# Patient Record
Sex: Female | Born: 1970
Health system: Southern US, Community
[De-identification: ages and names within clinical notes are randomized; demographics above are authoritative.]

## PROBLEM LIST (undated history)

## (undated) DIAGNOSIS — Z8619 Personal history of other infectious and parasitic diseases: Secondary | ICD-10-CM

## (undated) DIAGNOSIS — B009 Herpesviral infection, unspecified: Secondary | ICD-10-CM

## (undated) DIAGNOSIS — D649 Anemia, unspecified: Secondary | ICD-10-CM

## (undated) DIAGNOSIS — T7840XA Allergy, unspecified, initial encounter: Secondary | ICD-10-CM

## (undated) HISTORY — DX: Personal history of other infectious and parasitic diseases: Z86.19

## (undated) HISTORY — DX: Allergy, unspecified, initial encounter: T78.40XA

## (undated) HISTORY — PX: HERNIA REPAIR: SHX51

## (undated) HISTORY — DX: Herpesviral infection, unspecified: B00.9

## (undated) HISTORY — DX: Anemia, unspecified: D64.9

---

## 2013-08-23 ENCOUNTER — Ambulatory Visit: Payer: BC Managed Care – PPO | Admitting: Obstetrics & Gynecology

## 2013-09-01 ENCOUNTER — Encounter: Payer: Self-pay | Admitting: Obstetrics & Gynecology

## 2013-09-01 ENCOUNTER — Ambulatory Visit (INDEPENDENT_AMBULATORY_CARE_PROVIDER_SITE_OTHER): Payer: BC Managed Care – PPO | Admitting: Obstetrics & Gynecology

## 2013-09-01 VITALS — BP 103/66 | HR 96 | Temp 99.4°F | Ht 63.0 in | Wt 122.0 lb

## 2013-09-01 DIAGNOSIS — N898 Other specified noninflammatory disorders of vagina: Secondary | ICD-10-CM

## 2013-09-01 DIAGNOSIS — A6000 Herpesviral infection of urogenital system, unspecified: Secondary | ICD-10-CM

## 2013-09-01 MED ORDER — VALACYCLOVIR HCL 500 MG PO TABS: 500.0000 mg | ORAL_TABLET | Freq: Two times a day (BID) | ORAL | Status: DC

## 2013-09-01 NOTE — Progress Notes (Signed)
Subjective:     Misty Flynn is a 42 y.o. female here for a routine exam.  Current complaints: vaginal discharge. Pt states her discharge is white. Pt denies any itching odor or burning.  Personal health questionnaire reviewed: yes.   Gynecologic History Patient's last menstrual period was 08/11/2013. Contraception: condoms Last Pap: 03/2013. Results were: normal   Obstetric History OB History  No data available     The following portions of the patient's history were reviewed and updated as appropriate: allergies, current medications, past family history, past medical history, past social history, past surgical history and problem list.  Review of Systems Pertinent items are noted in HPI.    Objective:  SPEC: thin, white discharge    Assessment:    Healthy female exam.  H/O genital herpes   Plan:   Orders Placed This Encounter  Procedures  . WET PREP BY MOLECULAR PROBE  . POCT Wet Prep [facility])  Return prn

## 2013-09-02 DIAGNOSIS — A6000 Herpesviral infection of urogenital system, unspecified: Secondary | ICD-10-CM | POA: Insufficient documentation

## 2013-09-02 LAB — POCT WET PREP (WET MOUNT)

## 2013-09-02 LAB — WET PREP BY MOLECULAR PROBE: Trichomonas vaginosis: NEGATIVE

## 2013-09-02 NOTE — Patient Instructions (Signed)
Genital Herpes  Genital herpes is a sexually transmitted disease. This means that it is a disease passed by having sex with an infected person. There is no cure for genital herpes. The time between attacks can be months to years. The virus may live in a person but produce no problems (symptoms). This infection can be passed to a baby as it travels down the birth canal (vagina). In a newborn, this can cause central nervous system damage, eye damage, or even death. The virus that causes genital herpes is usually HSV-2 virus. The virus that causes oral herpes is usually HSV-1. The diagnosis (learning what is wrong) is made through culture results.  SYMPTOMS   Usually symptoms of pain and itching begin a few days to a week after contact. It first appears as small blisters that progress to small painful ulcers which then scab over and heal after several days. It affects the outer genitalia, birth canal, cervix, penis, anal area, buttocks, and thighs.  HOME CARE INSTRUCTIONS   · Keep ulcerated areas dry and clean.  · Take medications as directed. Antiviral medications can speed up healing. They will not prevent recurrences or cure this infection. These medications can also be taken for suppression if there are frequent recurrences.  · While the infection is active, it is contagious. Avoid all sexual contact during active infections.  · Condoms may help prevent spread of the herpes virus.  · Practice safe sex.  · Wash your hands thoroughly after touching the genital area.  · Avoid touching your eyes after touching your genital area.  · Inform your caregiver if you have had genital herpes and become pregnant. It is your responsibility to insure a safe outcome for your baby in this pregnancy.  · Only take over-the-counter or prescription medicines for pain, discomfort, or fever as directed by your caregiver.  SEEK MEDICAL CARE IF:   · You have a recurrence of this infection.  · You do not respond to medications and are not  improving.  · You have new sources of pain or discharge which have changed from the original infection.  · You have an oral temperature above 102° F (38.9° C).  · You develop abdominal pain.  · You develop eye pain or signs of eye infection.  Document Released: 10/11/2000 Document Revised: 01/06/2012 Document Reviewed: 11/01/2009  ExitCare® Patient Information ©2014 [employer].

## 2013-10-04 ENCOUNTER — Ambulatory Visit: Payer: BC Managed Care – PPO | Admitting: Obstetrics & Gynecology

## 2014-02-28 ENCOUNTER — Ambulatory Visit: Payer: Self-pay | Admitting: Internal Medicine

## 2014-04-20 ENCOUNTER — Ambulatory Visit: Payer: BC Managed Care – PPO | Admitting: Obstetrics & Gynecology

## 2014-04-21 ENCOUNTER — Ambulatory Visit (INDEPENDENT_AMBULATORY_CARE_PROVIDER_SITE_OTHER): Payer: BC Managed Care – PPO | Admitting: Obstetrics & Gynecology

## 2014-04-21 ENCOUNTER — Encounter: Payer: Self-pay | Admitting: Obstetrics & Gynecology

## 2014-04-21 VITALS — BP 97/57 | HR 88 | Temp 97.9°F | Ht 62.0 in | Wt 124.0 lb

## 2014-04-21 DIAGNOSIS — N9489 Other specified conditions associated with female genital organs and menstrual cycle: Secondary | ICD-10-CM

## 2014-04-21 DIAGNOSIS — N907 Vulvar cyst: Secondary | ICD-10-CM

## 2014-04-21 NOTE — Progress Notes (Signed)
Patient ID: Leafy Half, female   DOB: Nov 16, 1970, 43 y.o.   MRN: [id number]  Chief Complaint  Patient presents with  . Vaginal Bump    HPI Misty Flynn is a 43 y.o. female.   Vaginal Discharge The patient's primary symptoms include genital lesions and a vaginal discharge. The current episode started 1 to 4 weeks ago. The problem has been unchanged. The patient is experiencing no pain.    History reviewed. No pertinent past medical history.  Past Surgical History  Procedure Laterality Date  . Cesarean section    . Hernia repair      Family History  Problem Relation Age of Onset  . Diabetes Maternal Grandmother   . Cancer Maternal Grandfather     Social History History  Substance Use Topics  . Smoking status: Never Smoker   . Smokeless tobacco: Not on file  . Alcohol Use: No    No Known Allergies  Current Outpatient Prescriptions  Medication Sig Dispense Refill  . valACYclovir (VALTREX) 500 MG tablet Take 1 tablet (500 mg total) by mouth 2 (two) times daily. With symptoms of outbreak.  30 tablet  3   No current facility-administered medications for this visit.    Review of Systems Review of Systems  Genitourinary: Positive for vaginal discharge.   Constitutional: negative for fatigue and weight loss Respiratory: negative for cough and wheezing Cardiovascular: negative for chest pain, fatigue and palpitations Gastrointestinal: negative for abdominal pain and change in bowel habits Genitourinary: positive for a bump Integument/breast: negative for nipple discharge Musculoskeletal:negative for myalgias Neurological: negative for gait problems and tremors Behavioral/Psych: negative for abusive relationship, depression Endocrine: negative for temperature intolerance     Blood pressure 97/57, pulse 88, temperature 97.9 F (36.6 C), height 5\' 2"  (1.575 m), weight 56.246 kg (124 lb), last menstrual period 03/26/2014.  Physical Exam Physical Exam General:    alert  Skin:   no rash or abnormalities  Lungs:   clear to auscultation bilaterally  Heart:   regular rate and rhythm, S1, S2 normal, no murmur, click, rub or gallop  Breasts:   normal without suspicious masses, skin or nipple changes or axillary nodes  Abdomen:  normal findings: no organomegaly, soft, non-tender and no hernia  Pelvis:  External genitalia: right labia minora with nodular area, 0.5 cm, NT        Data Reviewed None  Assessment    Likely sebaceous cyst--minimal symptoms    Plan    Possible management options include: expectant for now; excision for symptoms Follow up as needed.         JACKSON-MOORE,LISA A 04/21/2014, 11:04 AM

## 2014-04-22 NOTE — Patient Instructions (Signed)
Epidermal Cyst An epidermal cyst is sometimes called a sebaceous cyst, epidermal inclusion cyst, or infundibular cyst. These cysts usually contain a substance that looks "pasty" or "cheesy" and may have a bad smell. This substance is a protein called keratin. Epidermal cysts are usually found on the face, neck, or trunk. They may also occur in the vaginal area or other parts of the genitalia of both men and women. Epidermal cysts are usually small, painless, slow-growing bumps or lumps that move freely under the skin. It is important not to try to pop them. This may cause an infection and lead to tenderness and swelling. CAUSES  Epidermal cysts may be caused by a deep penetrating injury to the skin or a plugged hair follicle, often associated with acne. SYMPTOMS  Epidermal cysts can become inflamed and cause:  Redness.  Tenderness.  Increased temperature of the skin over the bumps or lumps.  Grayish-white, bad smelling material that drains from the bump or lump. DIAGNOSIS  Epidermal cysts are easily diagnosed by your caregiver during an exam. Rarely, a tissue sample (biopsy) may be taken to rule out other conditions that may resemble epidermal cysts. TREATMENT   Epidermal cysts often get better and disappear on their own. They are rarely ever cancerous.  If a cyst becomes infected, it may become inflamed and tender. This may require opening and draining the cyst. Treatment with antibiotics may be necessary. When the infection is gone, the cyst may be removed with minor surgery.  Small, inflamed cysts can often be treated with antibiotics or by injecting steroid medicines.  Sometimes, epidermal cysts become large and bothersome. If this happens, surgical removal in your caregiver's office may be necessary. HOME CARE INSTRUCTIONS  Only take over-the-counter or prescription medicines as directed by your caregiver.  Take your antibiotics as directed. Finish them even if you start to feel  better. SEEK MEDICAL CARE IF:   Your cyst becomes tender, red, or swollen.  Your condition is not improving or is getting worse.  You have any other questions or concerns. MAKE SURE YOU:  Understand these instructions.  Will watch your condition.  Will get help right away if you are not doing well or get worse. Document Released: 09/14/2004 Document Revised: 01/06/2012 Document Reviewed: 04/22/2011 ExitCare Patient Information 2015 [employer]. This information is not intended to replace advice given to you by your health care provider. Make sure you discuss any questions you have with your health care provider.  

## 2014-05-13 ENCOUNTER — Ambulatory Visit: Payer: Self-pay | Admitting: Internal Medicine

## 2014-07-18 ENCOUNTER — Other Ambulatory Visit: Payer: Self-pay | Admitting: Obstetrics & Gynecology

## 2014-07-18 DIAGNOSIS — Z1231 Encounter for screening mammogram for malignant neoplasm of breast: Secondary | ICD-10-CM

## 2014-07-28 ENCOUNTER — Ambulatory Visit (HOSPITAL_COMMUNITY): Admission: RE | Admit: 2014-07-28 | Payer: Self-pay | Source: Ambulatory Visit

## 2014-08-29 ENCOUNTER — Encounter: Payer: Self-pay | Admitting: Family Medicine

## 2014-08-29 ENCOUNTER — Ambulatory Visit (INDEPENDENT_AMBULATORY_CARE_PROVIDER_SITE_OTHER): Payer: BC Managed Care – PPO | Admitting: Family Medicine

## 2014-08-29 VITALS — BP 100/70 | HR 104 | Temp 98.2°F | Ht 62.5 in | Wt 124.6 lb

## 2014-08-29 DIAGNOSIS — A6000 Herpesviral infection of urogenital system, unspecified: Secondary | ICD-10-CM

## 2014-08-29 DIAGNOSIS — Z0000 Encounter for general adult medical examination without abnormal findings: Secondary | ICD-10-CM

## 2014-08-29 NOTE — Progress Notes (Signed)
Pre visit review using our clinic review tool, if applicable. No additional management support is needed unless otherwise documented below in the visit note. 

## 2014-08-29 NOTE — Progress Notes (Signed)
Preventive visit and establish care. HPI:  Misty Flynn is here to establish care.  Last PCP and physical: sees Dr. Glennon Mac in gyn for breast and paps.   Has the following chronic problems and concerns today:  Patient Active Problem List   Diagnosis Date Noted  . Genital herpes 09/02/2013   Wants CPE. Thinks had tdap. Poor diet. No exercise. Does women's health with gyn.  ROS negative for unless reported above: fevers, unintentional weight loss, hearing or vision loss, chest pain, palpitations, struggling to breath, hemoptysis, melena, hematochezia, hematuria, falls, loc, si, thoughts of self harm  Past Medical History  Diagnosis Date  . HSV (herpes simplex virus) infection   . History of chicken pox     Past Surgical History  Procedure Laterality Date  . Cesarean section    . Hernia repair      Family History  Problem Relation Age of Onset  . Diabetes Maternal Grandmother   . Cancer Maternal Grandfather     prostate    History   Social History  . Marital Status: Single    Spouse Name: N/A    Number of Children: N/A  . Years of Education: N/A   Social History Main Topics  . Smoking status: Never Smoker   . Smokeless tobacco: None  . Alcohol Use: No  . Drug Use: No  . Sexual Activity: Yes    Birth Control/ Protection: Condom     Comment: Sometimes   Other Topics Concern  . 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 With symptoms of outbreak., Disp: 30 tablet, Rfl: 3  EXAM:  Filed Vitals:   08/29/14 1435  BP: 100/70  Pulse: 104  Temp: 98.2 F (36.8 C)    Body mass index is 22.41 kg/(m^2).  GENERAL: vitals reviewed and listed above, alert,  oriented, appears well hydrated and in no acute distress  HEENT: atraumatic, conjunttiva clear, no obvious abnormalities on inspection of external nose and ears  NECK: no obvious masses on inspection  LUNGS: clear to auscultation bilaterally, no wheezes, rales or rhonchi, good air movement  CV: HRRR, no peripheral edema  GU: Declined  ABD: soft, NTTP  MS: moves all extremities without noticeable abnormality  PSYCH: pleasant and cooperative, no obvious depression or anxiety  ASSESSMENT AND PLAN:  Discussed the following assessment and plan:  Visit for preventive health examination - Plan: Lipid panel, Hemoglobin A1c  Genital herpes  -reviewed USPSTF level a and b recs -she will return for FASTING LABS -she will discuss screening mammo with her gynecologist -We reviewed the PMH, [place], FH, SH, Meds and Allergies. -We provided refills for any medications we will prescribe as needed. -We addressed current concerns per orders and patient instructions. -We have asked for records for pertinent exams, studies, vaccines and notes from previous providers. -We have advised patient to follow up per instructions below.   -Patient advised to return or notify a doctor immediately if symptoms worsen or persist or new concerns arise.  Patient Instructions  Before you leave: -schedule labs  -check on your tetanus vaccine  -We have ordered labs or studies  at this visit. It can take up to 1-2 weeks for results and processing. We will contact you with instructions IF your results are abnormal. Normal results will be released to your [facility]. If you have not heard from [place] or can not find your results in [facility] in 2 weeks please contact our office.   -We recommend the following healthy lifestyle measures: - eat a healthy diet consisting of lots of vegetables, fruits, beans, nuts, seeds, healthy meats such as white chicken and fish and whole grains.  - avoid fried foods, fast food, processed  foods, sodas, red meet and other fattening foods.  - get a least 150 minutes of aerobic exercise per week.           Colin Benton R.

## 2014-08-29 NOTE — Patient Instructions (Addendum)
Before you leave: -schedule labs  -check on your tetanus vaccine  -We have ordered labs or studies at this visit. It can take up to 1-2 weeks for results and processing. We will contact you with instructions IF your results are abnormal. Normal results will be released to your [facility]. If you have not heard from [place] or can not find your results in [facility] in 2 weeks please contact our office.   -We recommend the following healthy lifestyle measures: - eat a healthy diet consisting of lots of vegetables, fruits, beans, nuts, seeds, healthy meats such as white chicken and fish and whole grains.  - avoid fried foods, fast food, processed foods, sodas, red meet and other fattening foods.  - get a least 150 minutes of aerobic exercise per week.

## 2014-10-06 ENCOUNTER — Other Ambulatory Visit: Payer: Self-pay | Admitting: *Deleted

## 2014-10-06 DIAGNOSIS — Z1231 Encounter for screening mammogram for malignant neoplasm of breast: Secondary | ICD-10-CM

## 2014-10-06 DIAGNOSIS — A6000 Herpesviral infection of urogenital system, unspecified: Secondary | ICD-10-CM

## 2014-10-06 DIAGNOSIS — Z1239 Encounter for other screening for malignant neoplasm of breast: Secondary | ICD-10-CM

## 2014-10-06 MED ORDER — VALACYCLOVIR HCL 500 MG PO TABS: 500.0000 mg | ORAL_TABLET | Freq: Two times a day (BID) | ORAL | Status: DC

## 2014-10-20 ENCOUNTER — Ambulatory Visit: Payer: BC Managed Care – PPO | Admitting: Obstetrics & Gynecology

## 2014-10-24 ENCOUNTER — Encounter: Payer: Self-pay | Admitting: *Deleted

## 2014-10-25 ENCOUNTER — Encounter: Payer: Self-pay | Admitting: Obstetrics & Gynecology

## 2014-12-13 ENCOUNTER — Other Ambulatory Visit: Payer: Self-pay

## 2014-12-13 ENCOUNTER — Other Ambulatory Visit (INDEPENDENT_AMBULATORY_CARE_PROVIDER_SITE_OTHER): Payer: BLUE CROSS/BLUE SHIELD

## 2014-12-13 DIAGNOSIS — Z0000 Encounter for general adult medical examination without abnormal findings: Secondary | ICD-10-CM

## 2014-12-13 LAB — LIPID PANEL
CHOLESTEROL: 153 mg/dL (ref 0–200)
HDL: 43.5 mg/dL (ref >39.00)
LDL Cholesterol: 99 mg/dL (ref 0–99)
NonHDL: 109.5
TRIGLYCERIDES: 52 mg/dL (ref 0.0–149.0)
Total CHOL/HDL Ratio: 4
VLDL: 10.4 mg/dL (ref 0.0–40.0)

## 2014-12-13 LAB — HEMOGLOBIN A1C: Hgb A1c MFr Bld: 5.9 % (ref 4.6–6.5)

## 2014-12-19 ENCOUNTER — Ambulatory Visit: Payer: BC Managed Care – PPO | Admitting: Obstetrics

## 2014-12-29 ENCOUNTER — Ambulatory Visit: Payer: BLUE CROSS/BLUE SHIELD | Admitting: Certified Nurse Midwife

## 2014-12-29 ENCOUNTER — Ambulatory Visit: Payer: BC Managed Care – PPO | Admitting: Obstetrics

## 2015-01-18 ENCOUNTER — Ambulatory Visit: Payer: BLUE CROSS/BLUE SHIELD | Admitting: Certified Nurse Midwife

## 2015-02-28 ENCOUNTER — Ambulatory Visit: Payer: BLUE CROSS/BLUE SHIELD | Admitting: Certified Nurse Midwife

## 2015-03-01 ENCOUNTER — Other Ambulatory Visit: Payer: Self-pay | Admitting: Obstetrics

## 2015-03-01 ENCOUNTER — Encounter: Payer: Self-pay | Admitting: Certified Nurse Midwife

## 2015-03-01 ENCOUNTER — Ambulatory Visit (INDEPENDENT_AMBULATORY_CARE_PROVIDER_SITE_OTHER): Payer: BLUE CROSS/BLUE SHIELD | Admitting: Certified Nurse Midwife

## 2015-03-01 VITALS — BP 112/71 | HR 103 | Temp 96.9°F | Ht 62.0 in | Wt 120.0 lb

## 2015-03-01 DIAGNOSIS — A6000 Herpesviral infection of urogenital system, unspecified: Secondary | ICD-10-CM | POA: Diagnosis not present

## 2015-03-01 MED ORDER — LIDOCAINE VISCOUS 2 % MT SOLN: 20.0000 mL | OROMUCOSAL | Status: DC | PRN

## 2015-03-01 MED ORDER — VALACYCLOVIR HCL 500 MG PO TABS: 500.0000 mg | ORAL_TABLET | Freq: Two times a day (BID) | ORAL | Status: DC

## 2015-03-01 NOTE — Progress Notes (Signed)
Patient ID: Misty Flynn, female   DOB: [birth year], 44 y.o.   MRN: [id number]   Chief Complaint  Patient presents with  . Vaginitis    Vaginal Odor, Possible Herpes Outbreak    HPI Misty Flynn is a 44 y.o. female.  Having period, and then got herpes outbreak, having perineal swelling with discharge and odor.  Had recent stress from engagement party.  Had recent infection?flu.      HPI  Past Medical History  Diagnosis Date  . HSV (herpes simplex virus) infection   . History of chicken pox     Past Surgical History  Procedure Laterality Date  . Cesarean section    . Hernia repair      Family History  Problem Relation Age of Onset  . Diabetes Maternal Grandmother   . Cancer Maternal Grandfather     prostate    Social History History  Substance Use Topics  . Smoking status: Never Smoker   . Smokeless tobacco: Not on file  . Alcohol Use: No    No Known Allergies  Current Outpatient Prescriptions  Medication Sig Dispense Refill  . valACYclovir (VALTREX) 500 MG tablet Take 1 tablet (500 mg total) by mouth 2 (two) times daily. With symptoms of outbreak. 30 tablet 3  . lidocaine (XYLOCAINE) 2 % solution Use as directed 20 mLs in the mouth or throat as needed for mouth pain. 100 mL 0   No current facility-administered medications for this visit.    Review of Systems Review of Systems Constitutional: negative for fatigue and weight loss, +fever yesturday Respiratory: negative for cough and wheezing Cardiovascular: negative for chest pain, fatigue and palpitations Gastrointestinal: negative for abdominal pain and change in bowel habits Genitourinary: + pruritus, discharge Integument/breast: negative for nipple discharge Musculoskeletal:negative for myalgias Neurological: negative for gait problems and tremors Behavioral/Psych: negative for abusive relationship, depression Endocrine: negative for temperature intolerance     Blood pressure 112/71, pulse 103,  temperature 96.9 F (36.1 C), height 5\' 2"  (1.575 m), weight 54.432 kg (120 lb).  Physical Exam Physical Exam General:   alert  Skin:   no rash or abnormalities  Lungs:   clear to auscultation bilaterally  Heart:   regular rate and rhythm, S1, S2 normal, no murmur, click, rub or gallop  Breasts:   deferred  Abdomen:  normal findings: no organomegaly, soft, non-tender and no hernia  Pelvis:  External genitalia: + multiple pustule lesions on labia and introitus.  very tender to palpation     50% of 15 min visit spent on counseling and coordination of care.   Data Reviewed Previous medical hx, labs  Assessment     Current herpes simplex outbreak     Plan    Orders Placed This Encounter  Procedures  . SureSwab, Vaginosis/Vaginitis Plus  . HIV antibody (with reflex)  . Hepatitis B surface antigen  . RPR  . Hepatitis C antibody   Meds ordered this encounter  Medications  . valACYclovir (VALTREX) 500 MG tablet    Sig: Take 1 tablet (500 mg total) by mouth 2 (two) times daily. With symptoms of outbreak.    Dispense:  30 tablet    Refill:  3  . lidocaine (XYLOCAINE) 2 % solution    Sig: Use as directed 20 mLs in the mouth or throat as needed for mouth pain.    Dispense:  100 mL    Refill:  0     Follow up with annual exam.

## 2015-03-02 ENCOUNTER — Telehealth: Payer: Self-pay | Admitting: *Deleted

## 2015-03-02 ENCOUNTER — Other Ambulatory Visit: Payer: Self-pay | Admitting: *Deleted

## 2015-03-02 DIAGNOSIS — A6000 Herpesviral infection of urogenital system, unspecified: Secondary | ICD-10-CM

## 2015-03-02 LAB — HEPATITIS B SURFACE ANTIGEN: Hepatitis B Surface Ag: NEGATIVE

## 2015-03-02 LAB — RPR

## 2015-03-02 LAB — HIV ANTIBODY (ROUTINE TESTING W REFLEX): HIV 1&2 Ab, 4th Generation: NONREACTIVE

## 2015-03-02 LAB — HEPATITIS C ANTIBODY: HCV Ab: NEGATIVE

## 2015-03-02 MED ORDER — LIDOCAINE HCL 2 % EX GEL: 1.0000 "application " | Freq: Three times a day (TID) | CUTANEOUS | Status: DC

## 2015-03-02 NOTE — Progress Notes (Signed)
Pt called to office stating Rx was sent wrong.  Pt states that she was given a solution and not cream.   Lidocaine gel was ordered and sent to pharmacy.  LM on VM making her aware of Rx change.

## 2015-03-02 NOTE — Telephone Encounter (Signed)
Pt called to office regarding labs.  Pt was made aware of results.  Pt made aware that SureSwab was not yet resulted.  Pt advised that she may get a call once that has resulted and been reviewed.

## 2015-03-05 LAB — SURESWAB, VAGINOSIS/VAGINITIS PLUS
ATOPOBIUM VAGINAE: NOT DETECTED Log (cells/mL)
BV CATEGORY: UNDETERMINED — AB
C. PARAPSILOSIS, DNA: NOT DETECTED
C. TRACHOMATIS RNA, TMA: NOT DETECTED
C. TROPICALIS, DNA: NOT DETECTED
C. albicans, DNA: NOT DETECTED
C. glabrata, DNA: NOT DETECTED
Gardnerella vaginalis: 6.5 Log (cells/mL)
LACTOBACILLUS SPECIES: 8 Log (cells/mL)
MEGASPHAERA SPECIES: NOT DETECTED Log (cells/mL)
N. gonorrhoeae RNA, TMA: NOT DETECTED
T. vaginalis RNA, QL TMA: NOT DETECTED

## 2015-03-06 ENCOUNTER — Telehealth: Payer: Self-pay | Admitting: *Deleted

## 2015-03-06 LAB — HERPES SIMPLEX VIRUS CULTURE: ORGANISM ID, BACTERIA: NOT DETECTED

## 2015-03-06 NOTE — Telephone Encounter (Signed)
Patient is requesting acyclovir ointment. She states it helps more than anything.

## 2015-03-07 ENCOUNTER — Encounter (HOSPITAL_COMMUNITY): Payer: Self-pay | Admitting: *Deleted

## 2015-03-07 ENCOUNTER — Inpatient Hospital Stay (HOSPITAL_COMMUNITY)
Admission: AD | Admit: 2015-03-07 | Discharge: 2015-03-07 | Disposition: A | Discharge status: Home / Self Care | Payer: BLUE CROSS/BLUE SHIELD | Source: Ambulatory Visit | Attending: Obstetrics | Admitting: Obstetrics

## 2015-03-07 ENCOUNTER — Other Ambulatory Visit: Payer: Self-pay | Admitting: Certified Nurse Midwife

## 2015-03-07 DIAGNOSIS — A57 Chancroid: Secondary | ICD-10-CM | POA: Diagnosis not present

## 2015-03-07 DIAGNOSIS — A510 Primary genital syphilis: Secondary | ICD-10-CM | POA: Diagnosis not present

## 2015-03-07 DIAGNOSIS — N898 Other specified noninflammatory disorders of vagina: Secondary | ICD-10-CM | POA: Insufficient documentation

## 2015-03-07 LAB — URINALYSIS, ROUTINE W REFLEX MICROSCOPIC
BILIRUBIN URINE: NEGATIVE
Glucose, UA: NEGATIVE mg/dL
Hgb urine dipstick: NEGATIVE
KETONES UR: NEGATIVE mg/dL
LEUKOCYTES UA: NEGATIVE
NITRITE: NEGATIVE
Protein, ur: 30 mg/dL — AB
Specific Gravity, Urine: 1.015 (ref 1.005–1.030)
Urobilinogen, UA: 0.2 mg/dL (ref 0.0–1.0)
pH: 8 (ref 5.0–8.0)

## 2015-03-07 LAB — URINE MICROSCOPIC-ADD ON

## 2015-03-07 LAB — WET PREP, GENITAL
Clue Cells Wet Prep HPF POC: NONE SEEN
TRICH WET PREP: NONE SEEN
Yeast Wet Prep HPF POC: NONE SEEN

## 2015-03-07 LAB — POCT PREGNANCY, URINE: PREG TEST UR: NEGATIVE

## 2015-03-07 MED ORDER — CEFTRIAXONE SODIUM 250 MG IJ SOLR
250.0000 mg | Freq: Once | INTRAMUSCULAR | Status: AC
  Administered 2015-03-07: 250 mg via INTRAMUSCULAR
  Filled 2015-03-07: qty 250

## 2015-03-07 MED ORDER — LIDOCAINE HCL 2 % EX GEL: 1.0000 "application " | Freq: Three times a day (TID) | CUTANEOUS | Status: DC

## 2015-03-07 NOTE — MAU Note (Signed)
PT SAYS  SHE GOES TO [facility] .   NO MEDS .    OFFICE  CALLED HER TODAY  AND  TOLD HER TO COME TO [facility]  TO GET CX-  OFFICE SAYS SHE HAS BV  AND  SHE NEEDED TO GET TREATMENT-   BUT THE RESUTS WERE ALL NEG.       STILL HAS VAG   D/C-  WITH ODOR-  [place].   NO BIRTH CONTROL.  LAST SEX-    April

## 2015-03-07 NOTE — Discharge Instructions (Signed)
Herpes Labialis  You have a fever blister or cold sore (herpes labialis). These painful, grouped sores are caused by one of the herpes viruses (HSV1 most commonly). They are usually found around the lips and mouth, but the same infection can also affect other areas on the face such as the nose and eyes. Herpes infections take about 10 days to heal. They often occur again and again in the same spot. Other symptoms may include numbness and tingling in the involved skin, achiness, fever, and swollen glands in the neck. Colds, emotional stress, injuries, or excess sunlight exposure all seem to make herpes reappear. Herpes lip infections are contagious. Direct contact with these sores can spread the infection. It can also be spread to other parts of your own body.  TREATMENT   Herpes labialis is usually self-limited and resolves within 1 week. To reduce pain and swelling, apply ice packs frequently to the sores or suck on popsicles or frozen juice bars. Antiviral medicine may be used by mouth to shorten the duration of the breakout. Avoid spreading the infection by washing your hands often. Be careful not to touch your eyes or genital areas after handling the infected blisters. Do not kiss or have other intimate contact with others. After the blisters are completely healed you may resume contact. Use sunscreen to lessen recurrences.   If this is your first infection with herpes, or if you have a severe or repeated infections, your caregiver may prescribe one of the anti-viral drugs to speed up the healing. If you have sun-related flare-ups despite the use of sunscreen, starting oral anti-viral medicine before a prolonged exposure (going skiing or to the beach) can prevent most episodes.   SEEK IMMEDIATE MEDICAL CARE IF:  · You develop a headache, sleepiness, high fever, vomiting, or severe weakness.  · You have eye irritation, pain, blurred vision or redness.  · You develop a prolonged infection not getting better in 10  days.  Document Released: 10/14/2005 Document Revised: 01/06/2012 Document Reviewed: 08/18/2009  ExitCare® Patient Information ©2015 [employer]. This information is not intended to replace advice given to you by your health care provider. Make sure you discuss any questions you have with your health care provider.

## 2015-03-07 NOTE — Telephone Encounter (Signed)
Patient has been notified of lab results- she works in [place] and she doesn't get back in town before we close. Per Dr Jodi Mourning- patient advised to go to triage- will have them call him for orders and he will treat patient at that time.

## 2015-03-07 NOTE — Telephone Encounter (Signed)
Most likely chancroid, needs Rocephin 250mg  IM along with PCR culture testing for H. Ducreyi.  Her partner also needs to be treated.  Thank you.

## 2015-03-07 NOTE — MAU Provider Note (Signed)
History     CSN: [id number]  Arrival date and time: 03/07/15 1851   First Provider Initiated Contact with Patient 03/07/15 2039      No chief complaint on file.  HPI  [name] is a 44 y.o. [id number] who presents to MAU today with complaint of vaginal sore and need for additional testing. The patient states that just after her menses, LMP 02/24/15, she developed fever and presented to Urgent Care in [place]. She states that she was diagnosed with [facility] Spotted Fever. She then states that last Wednesday she noted labial swelling and a sore. She was concerned for HSV outbreak, since she had been diagnosed with HSV many years ago, but states she had not had an outbreak in years. She called [facility] and was seen in the office on 03/01/15. She had testing done that day including RPR, HIV, HSV, GC/Chlamydia, HBV, HCV and BV culture. All tests were negative aside from BV which was equivocal. The patient states that symptoms were not improving and they had given her Rx for the wrong medication. She was supposed to get a lidocaine gel and was instead given a lidocaine solution which she did not use. She had started using and OTC "cold sore cream" with minimal relief. She called the office and requested topical acyclovir, which was sent to her pharmacy today. When she called the office today for new Rx she was asked to come in for further testing since symptoms had not improved and all other testing was negative. Patient told them she was at work and wouldn't be able to get to the office before closing tonight. Staff from [place] instructed patient to come to triage for further testing.   Patient states that she has had a yellow vaginal discharge x 6 days with mild odor noted. She denies vaginal bleeding. She did note some blood from the "sore" on her labia, but none today. She also noted some purulent drainage from this area previous, but none since she started using OTC "cold sore" cream. She  denies fever or N/V/D today. She continues to have pain at the site of the ulceration and mild labial swelling and associated discomfort.    OB History    Gravida Para Term Preterm AB TAB SAB Ectopic Multiple Living   3 0 0  2 2    1       Past Medical History  Diagnosis Date  . HSV (herpes simplex virus) infection   . History of chicken pox     Past Surgical History  Procedure Laterality Date  . Cesarean section    . Hernia repair      Family History  Problem Relation Age of Onset  . Diabetes Maternal Grandmother   . Cancer Maternal Grandfather     prostate    History  Substance Use Topics  . Smoking status: Never Smoker   . Smokeless tobacco: Not on file  . Alcohol Use: No    Allergies: No Known Allergies  Prescriptions prior to admission  Medication Sig Dispense Refill Last Dose  . Cyanocobalamin (B-12 PO) Take 1 tablet by mouth daily.   Past Week at Unknown time  . OVER THE COUNTER MEDICATION Apply 1 application topically 3 (three) times daily. Patient is using OTC ointment for cold sores.   03/07/2015 at Unknown time  . valACYclovir (VALTREX) 500 MG tablet Take 1 tablet (500 mg total) by mouth 2 (two) times daily. With symptoms of outbreak. 30 tablet 3 03/07/2015 at Unknown  time  . lidocaine (XYLOCAINE) 2 % jelly Apply 1 application topically 3 (three) times daily. (Patient not taking: Reported on 03/07/2015) 30 mL 0 Not Taking at Unknown time  . lidocaine (XYLOCAINE) 2 % solution Use as directed 20 mLs in the mouth or throat as needed for mouth pain. (Patient not taking: Reported on 03/07/2015) 100 mL 0 Not Taking at Unknown time    Review of Systems  Constitutional: Negative for fever and malaise/fatigue.  Gastrointestinal: Negative for nausea, vomiting, abdominal pain and diarrhea.  Genitourinary: Negative for dysuria, urgency and frequency.       + vaginal discharge, labial swelling, ulceration of the skin of the labia Neg - vaginal bleeding   Physical Exam    Blood pressure 100/69, pulse 87, temperature 98.1 F (36.7 C), temperature source Oral, height 5\' 1"  (1.549 m), weight 127 lb 2 oz (57.664 kg), last menstrual period 02/24/2015.  Physical Exam  Nursing note and vitals reviewed. Constitutional: She is oriented to person, place, and time. She appears well-developed and well-nourished. No distress.  HENT:  Head: Normocephalic and atraumatic.  Cardiovascular: Normal rate.   Respiratory: Effort normal.  GI: Soft.  Genitourinary:    Vaginal discharge (thick, white discharge noted) found.  Wet prep obtained without speculum due to patient discomfort. Small amount of thick, white discharge noted. No blood.   Neurological: She is alert and oriented to person, place, and time.  Skin: Skin is warm and dry. No erythema.  Psychiatric: She has a normal mood and affect.   Results for orders placed or performed during the hospital encounter of 03/07/15 (from the past 24 hour(s))  Urinalysis, Routine w reflex microscopic     Status: Abnormal   Collection Time: 03/07/15  7:44 PM  Result Value Ref Range   Color, Urine YELLOW YELLOW   APPearance CLEAR CLEAR   Specific Gravity, Urine 1.015 1.005 - 1.030   pH 8.0 5.0 - 8.0   Glucose, UA NEGATIVE NEGATIVE mg/dL   Hgb urine dipstick NEGATIVE NEGATIVE   Bilirubin Urine NEGATIVE NEGATIVE   Ketones, ur NEGATIVE NEGATIVE mg/dL   Protein, ur 30 (A) NEGATIVE mg/dL   Urobilinogen, UA 0.2 0.0 - 1.0 mg/dL   Nitrite NEGATIVE NEGATIVE   Leukocytes, UA NEGATIVE NEGATIVE  Urine microscopic-add on     Status: None   Collection Time: 03/07/15  7:44 PM  Result Value Ref Range   Squamous Epithelial / LPF RARE RARE   WBC, UA 0-2 <3 WBC/hpf   RBC / HPF 0-2 <3 RBC/hpf   Bacteria, UA RARE RARE   Urine-Other MUCOUS PRESENT   Pregnancy, urine POC     Status: None   Collection Time: 03/07/15  8:01 PM  Result Value Ref Range   Preg Test, Ur NEGATIVE NEGATIVE    MAU Course  Procedures None  MDM UPT -  negative UA, wet prep, wound culture and gram stain today 250 mg IM rocephin given to patient  Assessment and Plan  A: [place], possible H. [place]  P: Discharge home Patient treated with Rocephin in MAU Rx for lidocaine gel sent to patient's pharmacy Warning signs for worsening condition discussed Wound culture and gram stain pending. Results will be sent directly to [facility] as they were originally ordered by Kandis Cocking, CNM Patient advised to follow-up with [facility] as scheduled or sooner PRN Patient may return to MAU as needed or if her condition were to change or worsen   Luvenia Redden, PA-C  03/07/2015, 8:39 PM

## 2015-03-08 LAB — GRAM STAIN
Gram Stain: NONE SEEN
Special Requests: NORMAL

## 2015-03-11 LAB — CULTURE, ROUTINE-GENITAL
CULTURE: NORMAL
SPECIAL REQUESTS: NORMAL

## 2015-03-14 ENCOUNTER — Other Ambulatory Visit: Payer: Self-pay | Admitting: Obstetrics

## 2015-03-14 DIAGNOSIS — B9689 Other specified bacterial agents as the cause of diseases classified elsewhere: Secondary | ICD-10-CM

## 2015-03-14 DIAGNOSIS — L089 Local infection of the skin and subcutaneous tissue, unspecified: Principal | ICD-10-CM

## 2015-03-14 MED ORDER — SULFAMETHOXAZOLE-TRIMETHOPRIM 800-160 MG PO TABS: 1.0000 | ORAL_TABLET | Freq: Two times a day (BID) | ORAL | Status: DC

## 2015-04-12 ENCOUNTER — Ambulatory Visit: Payer: BLUE CROSS/BLUE SHIELD | Admitting: Obstetrics

## 2015-04-25 ENCOUNTER — Ambulatory Visit: Payer: BLUE CROSS/BLUE SHIELD | Admitting: Obstetrics

## 2015-04-25 ENCOUNTER — Ambulatory Visit (INDEPENDENT_AMBULATORY_CARE_PROVIDER_SITE_OTHER): Payer: BLUE CROSS/BLUE SHIELD | Admitting: Obstetrics

## 2015-04-25 ENCOUNTER — Encounter: Payer: Self-pay | Admitting: Certified Nurse Midwife

## 2015-04-25 VITALS — BP 91/60 | HR 82 | Temp 98.3°F | Ht 62.0 in | Wt 123.0 lb

## 2015-04-25 DIAGNOSIS — Z01419 Encounter for gynecological examination (general) (routine) without abnormal findings: Secondary | ICD-10-CM

## 2015-04-25 DIAGNOSIS — N941 Dyspareunia: Secondary | ICD-10-CM | POA: Diagnosis not present

## 2015-04-25 DIAGNOSIS — Z30011 Encounter for initial prescription of contraceptive pills: Secondary | ICD-10-CM | POA: Diagnosis not present

## 2015-04-25 DIAGNOSIS — Z124 Encounter for screening for malignant neoplasm of cervix: Secondary | ICD-10-CM

## 2015-04-25 DIAGNOSIS — N943 Premenstrual tension syndrome: Secondary | ICD-10-CM

## 2015-04-25 DIAGNOSIS — N939 Abnormal uterine and vaginal bleeding, unspecified: Secondary | ICD-10-CM

## 2015-04-25 DIAGNOSIS — IMO0002 Reserved for concepts with insufficient information to code with codable children: Secondary | ICD-10-CM

## 2015-04-25 MED ORDER — NORETHIN ACE-ETH ESTRAD-FE 1-20 MG-MCG(24) PO TABS: 1.0000 | ORAL_TABLET | Freq: Every day | ORAL | Status: DC

## 2015-04-25 NOTE — Progress Notes (Signed)
Subjective:        Misty Flynn is a 44 y.o. female here for a routine exam.  Current complaints: Occurrence of pain with intercourse on one occasion.  Recently treated for a labial sebaceous cyst.  Denies vaginal dryness or discharge.  Also c/o extreme irritability, bloating and meanness around period time only.  Treats family very bad around this time.  Personal health questionnaire:  Is patient [name], have a family history of breast and/or ovarian cancer: no Is there a family history of uterine cancer diagnosed at age < 23, gastrointestinal cancer, urinary tract cancer, family member who is a Field seismologist syndrome-associated carrier: no Is the patient overweight and hypertensive, family history of diabetes, personal history of gestational diabetes, preeclampsia or PCOS: no Is patient over 78, have PCOS,  family history of premature CHD under age 22, diabetes, smoke, have hypertension or peripheral artery disease:  no At any time, has a partner hit, kicked or otherwise hurt or frightened you?: no Over the past 2 weeks, have you felt down, depressed or hopeless?: no Over the past 2 weeks, have you felt little interest or pleasure in doing things?:no   Gynecologic History Patient's last menstrual period was 04/12/2015. Contraception: none Last Pap: 2015. Results were: normal Last mammogram: 2016. Results were: normal  Obstetric History OB History  Gravida Para Term Preterm AB SAB TAB Ectopic Multiple Living  3 0 0  2  2   1     # Outcome Date GA Lbr Len/2nd Weight Sex Delivery Anes PTL Lv  3 TAB           2 Gravida      CS-LTranv     1 TAB               Past Medical History  Diagnosis Date  . HSV (herpes simplex virus) infection   . History of chicken pox     Past Surgical History  Procedure Laterality Date  . Cesarean section    . Hernia repair       Current outpatient prescriptions:  .  valACYclovir (VALTREX) 500 MG tablet, Take 1 tablet (500 mg total) by  mouth 2 (two) times daily. With symptoms of outbreak., Disp: 30 tablet, Rfl: 3 .  Cyanocobalamin (B-12 PO), Take 1 tablet by mouth daily., Disp: , Rfl:  .  lidocaine (XYLOCAINE) 2 % jelly, Apply 1 application topically 3 (three) times daily. (Patient not taking: Reported on 04/25/2015), Disp: 30 mL, Rfl: 0 .  Norethindrone Acetate-Ethinyl Estrad-FE (LOESTRIN 24 FE) 1-20 MG-MCG(24) tablet, Take 1 tablet by mouth daily., Disp: 1 Package, Rfl: 11 .  OVER THE COUNTER MEDICATION, Apply 1 application topically 3 (three) times daily. Patient is using OTC ointment for cold sores., Disp: , Rfl:  .  sulfamethoxazole-trimethoprim (BACTRIM DS,SEPTRA DS) 800-160 MG per tablet, Take 1 tablet by mouth 2 (two) times daily. (Patient not taking: Reported on 04/25/2015), Disp: 28 tablet, Rfl: 1 No Known Allergies  History  Substance Use Topics  . Smoking status: Never Smoker   . Smokeless tobacco: Not on file  . Alcohol Use: No    Family History  Problem Relation Age of Onset  . Diabetes Maternal Grandmother   . Cancer Maternal Grandfather     prostate      Review of Systems  Constitutional: negative for fatigue and weight loss Respiratory: negative for cough and wheezing Cardiovascular: negative for chest pain, fatigue and palpitations Gastrointestinal: negative for abdominal pain and change in bowel habits  Musculoskeletal:negative for myalgias Neurological: negative for gait problems and tremors Behavioral/Psych: negative for abusive relationship, depression.  Positive for irritability, bloating and meanness around period time Endocrine: negative for temperature intolerance   Genitourinary:positive for abnormal menstrual periods.  Negative for genital lesions, hot flashes, sexual problems and vaginal discharge Integument/breast: negative for breast lump, breast tenderness, nipple discharge and skin lesion(s)    Objective:       BP 91/60 mmHg  Pulse 82  Temp(Src) 98.3 F (36.8 C)  Ht 5\' 2"   (1.575 m)  Wt 123 lb (55.792 kg)  BMI 22.49 kg/m2  LMP 04/12/2015 General:   alert  Skin:   no rash or abnormalities  Lungs:   clear to auscultation bilaterally  Heart:   regular rate and rhythm, S1, S2 normal, no murmur, click, rub or gallop  Breasts:   normal without suspicious masses, skin or nipple changes or axillary nodes  Abdomen:  normal findings: no organomegaly, soft, non-tender and no hernia  Pelvis:  External genitalia: normal general appearance Urinary system: urethral meatus normal and bladder without fullness, nontender Vaginal: normal without tenderness, induration or masses Cervix: normal appearance Adnexa: normal bimanual exam Uterus: anteverted and non-tender, normal size   Lab Review Urine pregnancy test Labs reviewed yes Radiologic studies reviewed yes    Assessment:    Healthy female exam.    AUB.  Irregular cycles.  Dyspareunia, one occasion  Contraceptive management and cycle regulation  Probable PMS     Plan:   Ultrasound ordered Loestrin 24 Rx.  Will follow dyspareunia clinically for recurrence Discussed diagnosis and treatment of PMS.  Literature dispensed.    Education reviewed: calcium supplements and self breast exams. Contraception: OCP (estrogen/progesterone). Follow up in: 2 weeks.   Meds ordered this encounter  Medications  . Norethindrone Acetate-Ethinyl Estrad-FE (LOESTRIN 24 FE) 1-20 MG-MCG(24) tablet    Sig: Take 1 tablet by mouth daily.    Dispense:  1 Package    Refill:  11   Orders Placed This Encounter  Procedures  . US Transvaginal Non-OB    Standing Status: Future     Number of Occurrences:      Standing Expiration Date: 06/24/2016    Order Specific Question:  Reason for Exam (SYMPTOM  OR DIAGNOSIS REQUIRED)    Answer:  Irregular cycles    Order Specific Question:  Preferred imaging location?    Answer:  Internal  . US Pelvis Complete    Standing Status: Future     Number of Occurrences:      Standing  Expiration Date: 06/24/2016    Order Specific Question:  Reason for Exam (SYMPTOM  OR DIAGNOSIS REQUIRED)    Answer:  Irregular cycles    Order Specific Question:  Preferred imaging location?    Answer:  Internal

## 2015-04-28 ENCOUNTER — Other Ambulatory Visit: Payer: Self-pay | Admitting: Obstetrics

## 2015-04-28 LAB — PAP IG AND HPV HIGH-RISK: HPV DNA HIGH RISK: NOT DETECTED

## 2015-05-02 NOTE — Progress Notes (Signed)
Patient notified- will review result with Dr Jodi Mourning at appointment 7/21

## 2015-05-12 ENCOUNTER — Encounter: Payer: BLUE CROSS/BLUE SHIELD | Admitting: Medical

## 2015-05-18 ENCOUNTER — Encounter: Payer: Self-pay | Admitting: Obstetrics

## 2015-05-18 ENCOUNTER — Telehealth: Payer: Self-pay

## 2015-05-18 ENCOUNTER — Ambulatory Visit (INDEPENDENT_AMBULATORY_CARE_PROVIDER_SITE_OTHER): Payer: BLUE CROSS/BLUE SHIELD

## 2015-05-18 ENCOUNTER — Ambulatory Visit (INDEPENDENT_AMBULATORY_CARE_PROVIDER_SITE_OTHER): Payer: BLUE CROSS/BLUE SHIELD | Admitting: Obstetrics

## 2015-05-18 VITALS — BP 114/71 | HR 80 | Temp 98.4°F | Ht 62.0 in | Wt 125.0 lb

## 2015-05-18 DIAGNOSIS — R87610 Atypical squamous cells of undetermined significance on cytologic smear of cervix (ASC-US): Secondary | ICD-10-CM | POA: Diagnosis not present

## 2015-05-18 DIAGNOSIS — D250 Submucous leiomyoma of uterus: Secondary | ICD-10-CM | POA: Diagnosis not present

## 2015-05-18 DIAGNOSIS — N939 Abnormal uterine and vaginal bleeding, unspecified: Secondary | ICD-10-CM

## 2015-05-18 DIAGNOSIS — N946 Dysmenorrhea, unspecified: Secondary | ICD-10-CM

## 2015-05-18 DIAGNOSIS — N943 Premenstrual tension syndrome: Secondary | ICD-10-CM | POA: Diagnosis not present

## 2015-05-18 MED ORDER — IBUPROFEN 800 MG PO TABS: 800.0000 mg | ORAL_TABLET | Freq: Three times a day (TID) | ORAL | Status: DC | PRN

## 2015-05-18 NOTE — Telephone Encounter (Signed)
Per Dr. Jodi Mourning, patient does not need sonohystergram in 3 months or follow up visit - can come back in a year for annual - let patient know and sch her next annual 05/21/2016

## 2015-05-18 NOTE — Progress Notes (Signed)
Patient ID: Misty Flynn, female   DOB: May 30, 1971, 44 y.o.   MRN: [id number]  Chief Complaint  Patient presents with  . Follow-up    HPI Misty Flynn is a 44 y.o. female.  Presents for follow up of abnormal pap smear.  She also has PMS and Dysmenorrhea, and was given treatment recommendations on her last office visit, and is following up on those problems also.  Has history of AUB with irregular cycles but no intermenstrual bleeding.  Ultrasound is being done today. HPI  Past Medical History  Diagnosis Date  . HSV (herpes simplex virus) infection   . History of chicken pox     Past Surgical History  Procedure Laterality Date  . Cesarean section    . Hernia repair      Family History  Problem Relation Age of Onset  . Diabetes Maternal Grandmother   . Cancer Maternal Grandfather     prostate    Social History History  Substance Use Topics  . Smoking status: Never Smoker   . Smokeless tobacco: Not on file  . Alcohol Use: No    No Known Allergies  Current Outpatient Prescriptions  Medication Sig Dispense Refill  . Norethindrone Acetate-Ethinyl Estrad-FE (LOESTRIN 24 FE) 1-20 MG-MCG(24) tablet Take 1 tablet by mouth daily. 1 Package 11  . Cyanocobalamin (B-12 PO) Take 1 tablet by mouth daily.    Marland Kitchen ibuprofen (ADVIL,MOTRIN) 800 MG tablet Take 1 tablet (800 mg total) by mouth every 8 (eight) hours as needed for cramping. 30 tablet prn  . lidocaine (XYLOCAINE) 2 % jelly Apply 1 application topically 3 (three) times daily. (Patient not taking: Reported on 04/25/2015) 30 mL 0  . OVER THE COUNTER MEDICATION Apply 1 application topically 3 (three) times daily. Patient is using OTC ointment for cold sores.    Marland Kitchen sulfamethoxazole-trimethoprim (BACTRIM DS,SEPTRA DS) 800-160 MG per tablet Take 1 tablet by mouth 2 (two) times daily. (Patient not taking: Reported on 04/25/2015) 28 tablet 1  . valACYclovir (VALTREX) 500 MG tablet Take 1 tablet (500 mg total) by mouth 2 (two) times daily.  With symptoms of outbreak. (Patient not taking: Reported on 05/18/2015) 30 tablet 3   No current facility-administered medications for this visit.    Review of Systems Review of Systems Constitutional: negative for fatigue and weight loss Respiratory: negative for cough and wheezing Cardiovascular: negative for chest pain, fatigue and palpitations Gastrointestinal: negative for abdominal pain and change in bowel habits Genitourinary: positive for AUB Integument/breast: negative for nipple discharge Musculoskeletal:negative for myalgias Neurological: negative for gait problems and tremors Behavioral/Psych: negative for abusive relationship, depression.  Positive for PMS. Endocrine: negative for temperature intolerance     Blood pressure 114/71, pulse 80, temperature 98.4 F (36.9 C), height 5\' 2"  (1.575 m), weight 125 lb (56.7 kg), last menstrual period 04/12/2015.  Physical Exam Physical Exam:  Deferred  100% of 20 min visit spent on counseling and coordination of care.   Data Reviewed Ultrasound Pap Smear  Assessment     ASCUS with negative High Risk HPV DNA AUB Uterine Fibroid - submucous Dysmenorrhea PMS     Plan    Repeat pap 1 year OCP's Rx for cycle regulation Will follow uterine fibroid with yearly ultrasounds Ibuprofen Rx for Dysmenorrhea Continue avoidance of caffeine around period time for PMS  No orders of the defined types were placed in this encounter.   Meds ordered this encounter  Medications  . ibuprofen (ADVIL,MOTRIN) 800 MG tablet    Sig: Take 1  tablet (800 mg total) by mouth every 8 (eight) hours as needed for cramping.    Dispense:  30 tablet    Refill:  prn

## 2015-11-07 ENCOUNTER — Other Ambulatory Visit: Payer: Self-pay | Admitting: *Deleted

## 2015-11-07 ENCOUNTER — Telehealth: Payer: Self-pay | Admitting: *Deleted

## 2015-11-07 DIAGNOSIS — Z30011 Encounter for initial prescription of contraceptive pills: Secondary | ICD-10-CM

## 2015-11-07 MED ORDER — NORETHIN ACE-ETH ESTRAD-FE 1-20 MG-MCG(24) PO TABS: 1.0000 | ORAL_TABLET | Freq: Every day | ORAL | Status: DC

## 2015-11-07 NOTE — Telephone Encounter (Signed)
Patient called yesterday and she got stuck out of town in the snow. She has missed her birth control for several days and wants to know what to do. Told patient how to double up to catch up- she is using for cycle control- her partner has had a vasectomy so she is not concerned about pregnancy. She may have BTB. Sent a spare pack to [facility] in [place] per her request.

## 2015-11-16 ENCOUNTER — Telehealth: Payer: Self-pay | Admitting: *Deleted

## 2015-11-16 NOTE — Telephone Encounter (Signed)
Patient is requesting a refill of Ibuprofen 800 mg she is out.

## 2015-11-17 ENCOUNTER — Other Ambulatory Visit: Payer: Self-pay | Admitting: Obstetrics

## 2015-11-17 DIAGNOSIS — N946 Dysmenorrhea, unspecified: Secondary | ICD-10-CM

## 2015-11-17 MED ORDER — IBUPROFEN 800 MG PO TABS
800.0000 mg | ORAL_TABLET | Freq: Three times a day (TID) | ORAL | Status: DC | PRN
Start: 2015-11-17 — End: 2016-03-18

## 2015-11-17 NOTE — Telephone Encounter (Signed)
Ibuprofen Rx. 

## 2015-11-27 NOTE — Telephone Encounter (Signed)
Rx was sent to pharmacy by provider  

## 2015-11-28 ENCOUNTER — Ambulatory Visit (INDEPENDENT_AMBULATORY_CARE_PROVIDER_SITE_OTHER): Payer: Managed Care, Other (non HMO) | Admitting: Family Medicine

## 2015-11-28 ENCOUNTER — Encounter: Payer: Self-pay | Admitting: Family Medicine

## 2015-11-28 VITALS — BP 98/70 | HR 83 | Temp 98.2°F | Ht 61.5 in | Wt 127.6 lb

## 2015-11-28 DIAGNOSIS — Z0000 Encounter for general adult medical examination without abnormal findings: Secondary | ICD-10-CM

## 2015-11-28 DIAGNOSIS — R002 Palpitations: Secondary | ICD-10-CM | POA: Diagnosis not present

## 2015-11-28 DIAGNOSIS — D649 Anemia, unspecified: Secondary | ICD-10-CM | POA: Diagnosis not present

## 2015-11-28 LAB — CBC
HCT: 36.8 % (ref 36.0–46.0)
HEMOGLOBIN: 11.9 g/dL — AB (ref 12.0–15.0)
MCHC: 32.3 g/dL (ref 30.0–36.0)
MCV: 79.8 fl (ref 78.0–100.0)
PLATELETS: 320 10*3/uL (ref 150.0–400.0)
RBC: 4.61 Mil/uL (ref 3.87–5.11)
RDW: 15.9 % — AB (ref 11.5–15.5)
WBC: 5.2 10*3/uL (ref 4.0–10.5)

## 2015-11-28 LAB — BASIC METABOLIC PANEL
BUN: 14 mg/dL (ref 6–23)
CALCIUM: 9.8 mg/dL (ref 8.4–10.5)
CHLORIDE: 105 meq/L (ref 96–112)
CO2: 28 mEq/L (ref 19–32)
CREATININE: 0.81 mg/dL (ref 0.40–1.20)
GFR: 82.37 mL/min (ref >60.00)
Glucose, Bld: 89 mg/dL (ref 70–99)
Potassium: 5.1 mEq/L (ref 3.5–5.1)
Sodium: 141 mEq/L (ref 135–145)

## 2015-11-28 LAB — LIPID PANEL
CHOLESTEROL: 185 mg/dL (ref 0–200)
HDL: 59.5 mg/dL (ref >39.00)
LDL Cholesterol: 116 mg/dL — ABNORMAL HIGH (ref 0–99)
NonHDL: 125.8
Total CHOL/HDL Ratio: 3
Triglycerides: 47 mg/dL (ref 0.0–149.0)
VLDL: 9.4 mg/dL (ref 0.0–40.0)

## 2015-11-28 LAB — TSH: TSH: 0.43 u[IU]/mL (ref 0.35–4.50)

## 2015-11-28 LAB — HEMOGLOBIN A1C: Hgb A1c MFr Bld: 5.8 % (ref 4.6–6.5)

## 2015-11-28 NOTE — Patient Instructions (Signed)
BEFORE YOU LEAVE: -EKG -labs -follow up yearly and as needed  Try to cut back on caffeine and consider counseling for stress. Follow up if palpitations persists or worsen or other concerns.  -We have ordered labs or studies at this visit. It can take up to 1-2 weeks for results and processing. We will contact you with instructions IF your results are abnormal. Normal results will be released to your [facility]. If you have not heard from [place] or can not find your results in [facility] in 2 weeks please contact our office.  We recommend the following healthy lifestyle measures: - eat a healthy whole foods diet consisting of regular small meals composed of vegetables, fruits, beans, nuts, seeds, healthy meats such as white chicken and fish and whole grains.  - avoid sweets, white starchy foods, fried foods, fast food, processed foods, sodas, red meet and other fattening foods.  - get a least 150-300 minutes of aerobic exercise per week.

## 2015-11-28 NOTE — Progress Notes (Addendum)
HPI:  Here for CPE:  -Concerns and/or follow up today:   Palpitations: -under a lot of stress with planning wedding and looking for house - no depression of panic -occ flutter in chest, brief -denies: sob, cp, syncope, fevers, malaise, DOE, swelling, frequent symptoms -does drink a lot of caffeine in beverages  -Diet: variety of foods, balance and well rounded - not a healthy diet  -Exercise: no regular exercise  -Taking folic acid, vitamin D or calcium: no  -Diabetes and Dyslipidemia Screening: FASTING for labs today  -Hx of HTN: no  -Vaccines: refused flu, reports UTD on tdap  -pap history:sees gyn - pap done 03/2015 with ascus hpv negative - reports repeat pap with gyn this summer  -FDLMP:11/24/15  -sexual activity: yes, female partner, no new partners  -wants STI testing (Hep C if born [birth year]-65): no  -FH breast, colon or ovarian ca: see FH Last mammogram: sees gyn, UTD Last colon cancer screening: n/a   -Alcohol, Tobacco, drug use: see social history  Review of Systems - no fevers, unintentional weight loss, vision loss, hearing loss, chest pain, sob, hemoptysis, melena, hematochezia, hematuria, genital discharge, changing or concerning skin lesions, bleeding, bruising, loc, thoughts of self harm or SI  Past Medical History  Diagnosis Date  . HSV (herpes simplex virus) infection   . History of chicken pox     Past Surgical History  Procedure Laterality Date  . Cesarean section    . Hernia repair      Family History  Problem Relation Age of Onset  . Diabetes Maternal Grandmother   . Cancer Maternal Grandfather     prostate    Social History   Social History  . Marital Status: Unknown    Spouse Name: N/A  . Number of Children: N/A  . Years of Education: N/A   Social History Main Topics  . Smoking status: Never Smoker   . Smokeless tobacco: None  . Alcohol Use: No  . Drug Use: No  . Sexual Activity:    Partners: Male    Birth Control/  Protection: Pill   Other Topics Concern  . 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  Cyanocobalamin (B-12 PO), Take 1 tablet by mouth daily., Disp: , Rfl:  .  ibuprofen (ADVIL,MOTRIN) 800 MG tablet, Take 1 tablet (800 mg total) by mouth every 8 (eight) hours as needed for cramping., Disp: 30 tablet, Rfl: prn .  lidocaine (XYLOCAINE) 2 % jelly, Apply 1 application topically 3 (three) times daily., Disp: 30 mL, Rfl: 0 .  Norethindrone Acetate-Ethinyl Estrad-FE (LOESTRIN 24 FE) 1-20 MG-MCG(24) tablet, Take 1 tablet by mouth daily., Disp: 1 Package, Rfl: 0 .  OVER THE COUNTER MEDICATION, Apply 1 application topically 3 (three) times daily. Patient is using OTC ointment for cold sores., Disp: , Rfl:  .  valACYclovir (VALTREX) 500 MG tablet, Take 1 tablet (500 mg total) by mouth 2 (two) times daily. With symptoms of outbreak., Disp: 30 tablet, Rfl: 3  EXAM:  Filed Vitals:   11/28/15 1143  BP: 98/70  Pulse: 83  Temp: 98.2 F (36.8 C)    GENERAL: vitals reviewed and listed below, alert, oriented, appears well hydrated and in  no acute distress  HEENT: head atraumatic, PERRLA, normal appearance of eyes, ears, nose and mouth. moist mucus membranes.  NECK: supple, no masses or lymphadenopathy  LUNGS: clear to auscultation bilaterally, no rales, rhonchi or wheeze  CV: HRRR, no peripheral edema or cyanosis, normal pedal pulses  BREAST: declined - does with gyn  ABDOMEN: bowel sounds normal, soft, non tender to palpation, no masses, no rebound or guarding  GU: declined - does with gyn  SKIN: no rash or abnormal lesions  MS: normal gait, moves all extremities normally  NEURO: CN II-XII grossly intact, normal muscle strength and sensation to light touch on  extremities  PSYCH: normal affect, pleasant and cooperative  ASSESSMENT AND PLAN:  Discussed the following assessment and plan:  Visit for preventive health examination - Plan: Lipid Panel, Hemoglobin A1c -Discussed and advised all [place] preventive services health task force level A and B recommendations for age, sex and risks.  -Advised at least 150 minutes of exercise per week and a healthy diet low in saturated fats and sweets and consisting of fresh fruits and vegetables, lean meats such as fish and white chicken and whole grains.  -FASTING labs, studies and vaccines per orders this encounter  Palpitations - Plan: EKG 12-Lead, TSH, CBC (no diff), Basic metabolic panel -sig additional work performed above and beyond routine physical to address this new complaint -EKG read and interpreted by me with NSR -we discussed possible serious and likely etiologies, workup and treatment, treatment risks and return precautions - discussed likely benign given infrequent and paucity of other symptoms -after this discussion, Keliah opted for EKG, stress management, reduce caffeine, consider holter if persists, worsens, new symptoms -of course, we advised Otelia  to return or notify a doctor immediately if symptoms worsen or persist or new concerns arise.    Orders Placed This Encounter  Procedures  . TSH  . Lipid Panel  . Hemoglobin A1c  . CBC (no diff)  . Basic metabolic panel  . EKG 12-Lead    Patient advised to return to clinic immediately if symptoms worsen or persist or new concerns.  Patient Instructions  BEFORE YOU LEAVE: -EKG -labs -follow up yearly and as needed  Try to cut back on caffeine and consider counseling for stress. Follow up if palpitations persists or worsen or other concerns.  -We have ordered labs or studies at this visit. It can take up to 1-2 weeks for results and processing. We will contact you with instructions IF your results are abnormal. Normal results  will be released to your [facility]. If you have not heard from [place] or can not find your results in [facility] in 2 weeks please contact our office.  We recommend the following healthy lifestyle measures: - eat a healthy whole foods diet consisting of regular small meals composed of vegetables, fruits, beans, nuts, seeds, healthy meats such as white chicken and fish and whole grains.  - avoid sweets, white starchy foods, fried foods, fast food, processed foods, sodas, red meet and other fattening foods.  - get a least 150-300 minutes of aerobic exercise per week.             No Follow-up on file.  Colin Benton R.

## 2015-11-28 NOTE — Progress Notes (Signed)
Pre visit review using our clinic review tool, if applicable. No additional management support is needed unless otherwise documented below in the visit note. 

## 2015-11-29 NOTE — Addendum Note (Signed)
Addended by: Agnes Lawrence on: 11/29/2015 08:51 AM   Modules accepted: Orders

## 2016-01-02 ENCOUNTER — Other Ambulatory Visit (INDEPENDENT_AMBULATORY_CARE_PROVIDER_SITE_OTHER): Payer: Managed Care, Other (non HMO)

## 2016-01-02 DIAGNOSIS — D649 Anemia, unspecified: Secondary | ICD-10-CM

## 2016-01-02 LAB — CBC WITH DIFFERENTIAL/PLATELET
BASOS PCT: 0.4 % (ref 0.0–3.0)
Basophils Absolute: 0 10*3/uL (ref 0.0–0.1)
Eosinophils Absolute: 0.1 10*3/uL (ref 0.0–0.7)
Eosinophils Relative: 1 % (ref 0.0–5.0)
HEMATOCRIT: 34.8 % — AB (ref 36.0–46.0)
Hemoglobin: 11.4 g/dL — ABNORMAL LOW (ref 12.0–15.0)
Lymphocytes Relative: 52.3 % — ABNORMAL HIGH (ref 12.0–46.0)
Lymphs Abs: 2.8 10*3/uL (ref 0.7–4.0)
MCHC: 32.8 g/dL (ref 30.0–36.0)
MCV: 80.4 fl (ref 78.0–100.0)
MONOS PCT: 7.3 % (ref 3.0–12.0)
Monocytes Absolute: 0.4 10*3/uL (ref 0.1–1.0)
NEUTROS ABS: 2.1 10*3/uL (ref 1.4–7.7)
NEUTROS PCT: 39 % — AB (ref 43.0–77.0)
PLATELETS: 297 10*3/uL (ref 150.0–400.0)
RBC: 4.32 Mil/uL (ref 3.87–5.11)
RDW: 15.7 % — AB (ref 11.5–15.5)
WBC: 5.3 10*3/uL (ref 4.0–10.5)

## 2016-01-04 ENCOUNTER — Other Ambulatory Visit: Payer: Self-pay | Admitting: Family Medicine

## 2016-01-04 DIAGNOSIS — D649 Anemia, unspecified: Secondary | ICD-10-CM

## 2016-01-05 ENCOUNTER — Telehealth: Payer: Self-pay | Admitting: *Deleted

## 2016-01-05 NOTE — Telephone Encounter (Signed)
Spoke with pt. She will get labs and start iron.

## 2016-01-05 NOTE — Telephone Encounter (Signed)
Patient left a message on my voicemail stating she was just waking up when I called her this am about her labs and that I was vague in the explanation and she is due to come back for more testing and questions if she should be worried about this?  Does not want to wait 2 weeks with this on her mind-wanted to know if the doctor could exlain this more? Please call the pt at ph#[phone].

## 2016-01-16 ENCOUNTER — Other Ambulatory Visit (INDEPENDENT_AMBULATORY_CARE_PROVIDER_SITE_OTHER): Payer: Managed Care, Other (non HMO)

## 2016-01-16 DIAGNOSIS — D649 Anemia, unspecified: Secondary | ICD-10-CM | POA: Diagnosis not present

## 2016-01-16 LAB — VITAMIN B12: Vitamin B-12: 215 pg/mL (ref 211–911)

## 2016-01-17 LAB — PATHOLOGIST SMEAR REVIEW

## 2016-01-17 LAB — IRON,TIBC AND FERRITIN PANEL
%SAT: 43 % (ref 11–50)
FERRITIN: 31 ng/mL (ref 10–232)
Iron: 136 ug/dL (ref 40–190)
TIBC: 320 ug/dL (ref 250–450)

## 2016-02-20 ENCOUNTER — Ambulatory Visit (INDEPENDENT_AMBULATORY_CARE_PROVIDER_SITE_OTHER): Payer: Managed Care, Other (non HMO) | Admitting: Family Medicine

## 2016-02-20 ENCOUNTER — Encounter: Payer: Self-pay | Admitting: Family Medicine

## 2016-02-20 VITALS — BP 100/78 | HR 76 | Temp 98.4°F | Ht 61.5 in | Wt 133.8 lb

## 2016-02-20 DIAGNOSIS — D649 Anemia, unspecified: Secondary | ICD-10-CM

## 2016-02-20 DIAGNOSIS — R002 Palpitations: Secondary | ICD-10-CM | POA: Diagnosis not present

## 2016-02-20 DIAGNOSIS — Z658 Other specified problems related to psychosocial circumstances: Secondary | ICD-10-CM | POA: Diagnosis not present

## 2016-02-20 DIAGNOSIS — F439 Reaction to severe stress, unspecified: Secondary | ICD-10-CM

## 2016-02-20 NOTE — Patient Instructions (Signed)
Schedule lab appointment in about 3 weeks  Continue iron therapy.  Follow-up in 6 months.

## 2016-02-20 NOTE — Progress Notes (Signed)
HPI:  Follow up:  Iron Def Anemia: -found incidentally on labs -labs suggest mild iron def anemia; she reports her mother has anemia and that her mother told her she has had anemia whole life -menstrual cycles are heavy, she is seeing a gynecologist for this and now is on birth control, she still has periods that last about 6 days and feels a super pad every 3 hours. Her period started 2 days ago -denies: regarding palpitations, syncope, melena, hematochezia -not interested in having any more children go to sleep  Palpitations/Stress: -EKG normal -mild, was under stress with planning wedding, date for wedding has been postponed to a later time and stress has improved, labs ok other then mild anemia -reports:doing  Much better, occasional palpitations, but rare -denies:chest pain, shortness of breath, dyspnea on exertion  ROS: See pertinent positives and negatives per HPI.  Past Medical History  Diagnosis Date  . HSV (herpes simplex virus) infection   . History of chicken pox     Past Surgical History  Procedure Laterality Date  . Cesarean section    . Hernia repair      Family History  Problem Relation Age of Onset  . Diabetes Maternal Grandmother   . Cancer Maternal Grandfather     prostate    Social History   Social History  . Marital Status: Unknown    Spouse Name: N/A  . Number of Children: N/A  . Years of Education: N/A   Social History Main Topics  . Smoking status: Never Smoker   . Smokeless tobacco: None  . Alcohol Use: No  . Drug Use: No  . Sexual Activity:    Partners: Male    Birth Control/ Protection: Pill   Other Topics Concern  . 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  Cyanocobalamin (B-12 PO), Take 1 tablet by mouth  daily., Disp: , Rfl:  .  ibuprofen (ADVIL,MOTRIN) 800 MG tablet, Take 1 tablet (800 mg total) by mouth every 8 (eight) hours as needed for cramping., Disp: 30 tablet, Rfl: prn .  lidocaine (XYLOCAINE) 2 % jelly, Apply 1 application topically 3 (three) times daily., Disp: 30 mL, Rfl: 0 .  Norethindrone Acetate-Ethinyl Estrad-FE (LOESTRIN 24 FE) 1-20 MG-MCG(24) tablet, Take 1 tablet by mouth daily., Disp: 1 Package, Rfl: 0 .  OVER THE COUNTER MEDICATION, Apply 1 application topically 3 (three) times daily. Patient is using OTC ointment for cold sores., Disp: , Rfl:  .  valACYclovir (VALTREX) 500 MG tablet, Take 1 tablet (500 mg total) by mouth 2 (two) times daily. With symptoms of outbreak., Disp: 30 tablet, Rfl: 3  EXAM:  Filed Vitals:   02/20/16 1310  BP: 100/78  Pulse: 76  Temp: 98.4 F (36.9 C)    Body mass index is 24.87 kg/(m^2).  GENERAL: vitals reviewed and listed above, alert, oriented, appears well hydrated and in no acute distress  HEENT: atraumatic, conjunttiva clear, no obvious abnormalities on inspection of external nose and ears  NECK: no obvious masses on inspection  LUNGS: clear to auscultation bilaterally, no wheezes, rales or rhonchi, good air movement  CV: HRRR, no peripheral edema  MS: moves all extremities without noticeable abnormality  PSYCH:  pleasant and cooperative, no obvious depression or anxiety  ASSESSMENT AND PLAN:  Discussed the following assessment and plan:  Anemia, unspecified anemia type - Plan: CBC (no diff) -Discussed causes, workup, treatment -Likely due to heavy menstrual bleeding and possible benign genetic condition -She opted to recheck, does not want to recheck near her cycle, she agrees to set up a lab appointment  Stress -improving  Palpitations -improving with reduction in stress  -Patient advised to return or notify a doctor immediately if symptoms worsen or persist or new concerns arise.  Patient Instructions  Schedule  lab appointment in about 3 weeks  Continue iron therapy.  Follow-up in 6 months.     Colin Benton R.

## 2016-02-20 NOTE — Progress Notes (Signed)
Pre visit review using our clinic review tool, if applicable. No additional management support is needed unless otherwise documented below in the visit note. 

## 2016-03-13 ENCOUNTER — Other Ambulatory Visit: Payer: Managed Care, Other (non HMO)

## 2016-03-18 ENCOUNTER — Telehealth: Payer: Self-pay | Admitting: *Deleted

## 2016-03-18 ENCOUNTER — Other Ambulatory Visit: Payer: Self-pay | Admitting: *Deleted

## 2016-03-18 ENCOUNTER — Other Ambulatory Visit: Payer: Self-pay | Admitting: Obstetrics

## 2016-03-18 DIAGNOSIS — N946 Dysmenorrhea, unspecified: Secondary | ICD-10-CM

## 2016-03-18 MED ORDER — IBUPROFEN 800 MG PO TABS
800.0000 mg | ORAL_TABLET | Freq: Three times a day (TID) | ORAL | Status: DC | PRN
Start: 2016-03-18 — End: 2016-06-05

## 2016-03-18 NOTE — Telephone Encounter (Signed)
Patient called for a refill of her Ibuprofen 800 mg. 2:00 Call to patient- she should have refills for 1 year- call pharmacy to let them know she needs refill.

## 2016-05-19 ENCOUNTER — Other Ambulatory Visit: Payer: Self-pay | Admitting: Obstetrics

## 2016-05-19 DIAGNOSIS — Z30011 Encounter for initial prescription of contraceptive pills: Secondary | ICD-10-CM

## 2016-05-21 ENCOUNTER — Ambulatory Visit (INDEPENDENT_AMBULATORY_CARE_PROVIDER_SITE_OTHER): Payer: Managed Care, Other (non HMO) | Admitting: Obstetrics

## 2016-05-21 ENCOUNTER — Encounter: Payer: Self-pay | Admitting: Obstetrics

## 2016-05-21 VITALS — BP 110/78 | HR 82 | Temp 99.0°F | Ht 62.0 in | Wt 135.4 lb

## 2016-05-21 DIAGNOSIS — Z01419 Encounter for gynecological examination (general) (routine) without abnormal findings: Secondary | ICD-10-CM | POA: Diagnosis not present

## 2016-05-21 DIAGNOSIS — Z3041 Encounter for surveillance of contraceptive pills: Secondary | ICD-10-CM

## 2016-05-21 NOTE — Progress Notes (Signed)
Subjective:        Misty Flynn is a 45 y.o. female here for a routine exam.  Current complaints: Nausea and breast tenderness with OCP's.  .    Personal health questionnaire:  Is patient Misty Flynn, have a family history of breast and/or ovarian cancer: no Is there a family history of uterine cancer diagnosed at age < 7, gastrointestinal cancer, urinary tract cancer, family member who is a Field seismologist syndrome-associated carrier: no Is the patient overweight and hypertensive, family history of diabetes, personal history of gestational diabetes, preeclampsia or PCOS: no Is patient over 25, have PCOS,  family history of premature CHD under age 65, diabetes, smoke, have hypertension or peripheral artery disease:  no At any time, has a partner hit, kicked or otherwise hurt or frightened you?: no Over the past 2 weeks, have you felt down, depressed or hopeless?: no Over the past 2 weeks, have you felt little interest or pleasure in doing things?:no   Gynecologic History Patient's last menstrual period was 04/24/2016. Contraception: OCP (estrogen/progesterone) Last Pap: 2016. Results were: ASCUS Last mammogram: 2017. Results were: normal  Obstetric History OB History  Gravida Para Term Preterm AB Living  3 0 0   2 1  SAB TAB Ectopic Multiple Live Births    2          # Outcome Date GA Lbr Len/2nd Weight Sex Delivery Anes PTL Lv  3 TAB           2 Gravida      CS-LTranv     1 TAB               Past Medical History:  Diagnosis Date  . History of chicken pox   . HSV (herpes simplex virus) infection     Past Surgical History:  Procedure Laterality Date  . CESAREAN SECTION    . HERNIA REPAIR       Current Outpatient Prescriptions:  .  BLISOVI 24 FE 1-20 MG-MCG(24) tablet, TAKE 1 TABLET BY MOUTH DAILY., Disp: 28 tablet, Rfl: 11 .  valACYclovir (VALTREX) 500 MG tablet, Take 1 tablet (500 mg total) by mouth 2 (two) times daily. With symptoms of outbreak., Disp: 30 tablet,  Rfl: 3 .  Cyanocobalamin (B-12 PO), Take 1 tablet by mouth daily., Disp: , Rfl:  .  ibuprofen (ADVIL,MOTRIN) 800 MG tablet, Take 1 tablet (800 mg total) by mouth every 8 (eight) hours as needed for cramping. (Patient not taking: Reported on 05/21/2016), Disp: 30 tablet, Rfl: prn .  lidocaine (XYLOCAINE) 2 % jelly, Apply 1 application topically 3 (three) times daily. (Patient not taking: Reported on 05/21/2016), Disp: 30 mL, Rfl: 0 .  OVER THE COUNTER MEDICATION, Apply 1 application topically 3 (three) times daily. Patient is using OTC ointment for cold sores., Disp: , Rfl:  No Known Allergies  Social History  Substance Use Topics  . Smoking status: Never Smoker  . Smokeless tobacco: Not on file  . Alcohol use No    Family History  Problem Relation Age of Onset  . Diabetes Maternal Grandmother   . Cancer Maternal Grandfather     prostate      Review of Systems  Constitutional: negative for fatigue and weight loss Respiratory: negative for cough and wheezing Cardiovascular: negative for chest pain, fatigue and palpitations Gastrointestinal: negative for abdominal pain and change in bowel habits Musculoskeletal:negative for myalgias Neurological: negative for gait problems and tremors Behavioral/Psych: negative for abusive relationship, depression Endocrine: negative for temperature  intolerance   Genitourinary:negative for abnormal menstrual periods, genital lesions, hot flashes, sexual problems and vaginal discharge Integument/breast: negative for breast lump, breast tenderness, nipple discharge and skin lesion(s)    Objective:       BP 110/78   Pulse 82   Temp 99 F (37.2 C)   Ht 5\' 2"  (1.575 m)   Wt 135 lb 6.4 oz (61.4 kg)   LMP 04/24/2016   BMI 24.76 kg/m  General:   alert  Skin:   no rash or abnormalities  Lungs:   clear to auscultation bilaterally  Heart:   regular rate and rhythm, S1, S2 normal, no murmur, click, rub or gallop  Breasts:   normal without suspicious  masses, skin or nipple changes or axillary nodes  Abdomen:  normal findings: no organomegaly, soft, non-tender and no hernia  Pelvis:  External genitalia: normal general appearance Urinary system: urethral meatus normal and bladder without fullness, nontender Vaginal: normal without tenderness, induration or masses Cervix: normal appearance Adnexa: normal bimanual exam Uterus: anteverted and non-tender, normal size   Lab Review Urine pregnancy test Labs reviewed yes Radiologic studies reviewed yes   Assessment:    Healthy female exam.    Contraceptive surveillance.  Having normal OCP side effects, probably accentuated by taking pills on empty stomach.   Plan:    Discussed taking OCP's with food to lessen side effects.  Education reviewed: calcium supplements, low fat, low cholesterol diet, self breast exams and weight bearing exercise. Contraception: OCP (estrogen/progesterone). Follow up in: 1 year.   No orders of the defined types were placed in this encounter.  No orders of the defined types were placed in this encounter.

## 2016-05-23 LAB — PAP IG AND HPV HIGH-RISK
HPV, HIGH-RISK: NEGATIVE
PAP SMEAR COMMENT: 0

## 2016-05-25 LAB — NUSWAB VG, CANDIDA 6SP
CANDIDA KRUSEI, NAA: NEGATIVE
Candida albicans, NAA: NEGATIVE
Candida glabrata, NAA: NEGATIVE
Candida lusitaniae, NAA: NEGATIVE
Candida parapsilosis, NAA: NEGATIVE
Candida tropicalis, NAA: NEGATIVE
Trich vag by NAA: NEGATIVE

## 2016-06-05 ENCOUNTER — Other Ambulatory Visit: Payer: Self-pay | Admitting: Obstetrics

## 2016-06-05 DIAGNOSIS — N946 Dysmenorrhea, unspecified: Secondary | ICD-10-CM

## 2016-08-20 ENCOUNTER — Ambulatory Visit: Payer: Managed Care, Other (non HMO) | Admitting: Family Medicine

## 2016-08-20 ENCOUNTER — Other Ambulatory Visit: Payer: Self-pay | Admitting: Obstetrics

## 2016-08-20 DIAGNOSIS — N946 Dysmenorrhea, unspecified: Secondary | ICD-10-CM

## 2016-08-22 ENCOUNTER — Telehealth: Payer: Self-pay | Admitting: Family Medicine

## 2016-08-22 NOTE — Telephone Encounter (Signed)
Appt made. Pt aware. Pt advised to be seen locally if symptoms increase. Nothing further needed.

## 2016-08-26 ENCOUNTER — Ambulatory Visit: Payer: Self-pay | Admitting: Family Medicine

## 2016-10-15 ENCOUNTER — Other Ambulatory Visit: Payer: Self-pay | Admitting: Obstetrics

## 2016-10-15 DIAGNOSIS — N946 Dysmenorrhea, unspecified: Secondary | ICD-10-CM

## 2016-12-01 NOTE — Progress Notes (Signed)
HPI:  Here for CPE:  -Concerns and/or follow up today: none Hx mild anemia, ? Iron def per smear, B12 on low end normal. She did not follow up as advised. Reports regular but very heavy menstrual cycles - seeing gyn and on OCP but not helping. occ palpitations but no weakness, dizziness, cold, fatigue, etc. Due for labs, flu vaccine. Refuses flu shot.  -Diet: not great -Exercise: no regular exercise -Diabetes and Dyslipidemia Screening:fasting for labs -Taking folic acid, vitamin D or calcium: no -Hx of HTN: no -Vaccines: due for flu vaccine - refuses  -pap history: sees Dr. Jodi Mourning - per gyn notes last pap 04/2016; neg with neg hpv -FDLMP: regular, monthly, a few weeks ago -sexual activity: yes, female partner, no new partners -wants STI testing (Hep C if born [birth year]-65): no -FH breast, colon or ovarian ca: see FH -Last mammogram: n/a -dexa:n/a  Last colon cancer screening:  -Alcohol, Tobacco, drug use: see social history  Review of Systems - no fevers, unintentional weight loss, vision loss, hearing loss, chest pain, sob, hemoptysis, melena, hematochezia, hematuria, genital discharge, changing or concerning skin lesions, bleeding, bruising, loc, thoughts of self harm or SI  Past Medical History:  Diagnosis Date  . History of chicken pox   . HSV (herpes simplex virus) infection     Past Surgical History:  Procedure Laterality Date  . CESAREAN SECTION    . HERNIA REPAIR      Family History  Problem Relation Age of Onset  . Diabetes Maternal Grandmother   . Cancer Maternal Grandfather     prostate    Social History   Social History  . Marital status: Unknown    Spouse name: N/A  . Number of children: N/A  . Years of education: N/A   Social History Main Topics  . Smoking status: Never Smoker  . Smokeless tobacco: Never Used  . Alcohol use No  . Drug use: No  . Sexual activity: Yes    Partners: Male    Birth control/ protection: Pill   Other Topics Concern   . 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  BLISOVI 24 FE 1-20 MG-MCG(24) tablet, TAKE 1 TABLET BY MOUTH DAILY., Disp: 28 tablet, Rfl: 11 .  Cyanocobalamin (B-12 PO), Take 1 tablet by mouth daily., Disp: , Rfl:  .  ibuprofen (ADVIL,MOTRIN) 800 MG tablet, TAKE 1 TABLET (800 MG TOTAL) BY MOUTH EVERY 8 (EIGHT) HOURS AS NEEDED FOR CRAMPING., Disp: 30 tablet, Rfl: 5 .  lidocaine (XYLOCAINE) 2 % jelly, Apply 1 application topically 3 (three) times daily., Disp: 30 mL, Rfl: 0 .  OVER THE COUNTER MEDICATION, Apply 1 application topically 3 (three) times daily. Patient is using OTC ointment for cold sores., Disp: , Rfl:  .  valACYclovir (VALTREX) 500 MG tablet, Take 1 tablet (500 mg total) by mouth 2 (two) times daily. With symptoms of outbreak., Disp: 30 tablet, Rfl: 3  EXAM:  Vitals:   12/02/16 1135  BP: 96/60  Pulse: 89  Temp: 97.5 F (36.4 C)    GENERAL: vitals reviewed and listed below, alert, oriented, appears well hydrated and in no acute distress  HEENT: head atraumatic, PERRLA, normal appearance of eyes,  ears, nose and mouth. moist mucus membranes.  NECK: supple, no masses or lymphadenopathy  LUNGS: clear to auscultation bilaterally, no rales, rhonchi or wheeze  CV: HRRR, no peripheral edema or cyanosis, normal pedal pulses  ABDOMEN: bowel sounds normal, soft, non tender to palpation, no masses, no rebound or guarding  GU/BREAST: declined - does with gyn  SKIN: no rash or abnormal lesions  MS: normal gait, moves all extremities normally  GU/BREAST: declined, does with gyn  NEURO: normal gait, speech and thought processing grossly intact, muscle tone grossly intact throughout  PSYCH: normal affect, pleasant and cooperative  ASSESSMENT AND  PLAN:  Discussed the following assessment and plan:  Encounter for preventive health examination - Plan: Lipid Panel, Hemoglobin A1c, BMP with eGFR  Iron deficiency anemia due to chronic blood loss - Plan: CBC (no diff)  Menorrhagia with regular cycle   -Discussed and advised all [place] preventive services health task force level A and B recommendations for age, sex and risks.  -Advised at least 150 minutes of exercise per week and a healthy diet with avoidance of (less then 1 serving per week) processed foods, white starches, red meat, fast foods and sweets and consisting of: * 5-9 servings of fresh fruits and vegetables (not corn or potatoes) *nuts and seeds, beans *olives and olive oil *lean meats such as fish and white chicken  *whole grains  -FASTING labs, studies and vaccines per orders this encounter  Orders Placed This Encounter  Procedures  . Lipid Panel  . Hemoglobin A1c  . BMP with eGFR  . CBC (no diff)    Patient advised to return to clinic immediately if symptoms worsen or persist or new concerns.  Patient Instructions  BEFORE YOU LEAVE: -follow up: yearly and as needed -lab  Vit D3 [phone] IU daily (cosco brand)  B12 1078mg sublingual a few days per week.  We have ordered labs or studies at this visit. It can take up to 1-2 weeks for results and processing. IF results require follow up or explanation, we will call you with instructions. Clinically stable results will be [facility] to your [facility] If you have not heard from [place] cannot find your results in [facility] weeks please contact our office at [phone]   If you are not yet [facility] please SIGN UP TODAY. We now offer online scheduling, same day appointments and extended hours. WHEN YOU DON'T FEEL YOUR BEST..Marland KitchenMarland KitchenE ARE HERE TO HELP.   We recommend the following healthy lifestyle for LIFE: 1) Small portions.   Tip: eat off of a salad plate instead of a dinner plate.  Tip: if you  need more or a snack choose fruits, veggies and/or a handful of nuts or seeds.  2) Eat a healthy clean diet.  * Tip: Avoid (less then 1 serving per week): processed foods, sweets, sweetened drinks, white starches (rice, flour, bread, potatoes, pasta, etc), red meat, fast foods, butter  *Tip: CHOOSE instead   * 5-9 servings per day of fresh or frozen fruits and vegetables (but not corn, potatoes, bananas, canned or dried fruit)   *nuts and seeds, beans   *olives and olive oil   *small portions of lean meats such as fish and white chicken    *small portions of whole grains  3)Get at least 150 minutes of sweaty aerobic exercise per week.  4)Reduce stress - consider counseling, meditation and relaxation to balance other aspects of your life.  No Follow-up on file.  Colin Benton R., DO

## 2016-12-02 ENCOUNTER — Ambulatory Visit (INDEPENDENT_AMBULATORY_CARE_PROVIDER_SITE_OTHER): Payer: Managed Care, Other (non HMO) | Admitting: Family Medicine

## 2016-12-02 ENCOUNTER — Encounter: Payer: Self-pay | Admitting: Family Medicine

## 2016-12-02 VITALS — BP 96/60 | HR 89 | Temp 97.5°F | Ht 62.0 in | Wt 139.0 lb

## 2016-12-02 DIAGNOSIS — D500 Iron deficiency anemia secondary to blood loss (chronic): Secondary | ICD-10-CM

## 2016-12-02 DIAGNOSIS — N920 Excessive and frequent menstruation with regular cycle: Secondary | ICD-10-CM | POA: Diagnosis not present

## 2016-12-02 DIAGNOSIS — Z0000 Encounter for general adult medical examination without abnormal findings: Secondary | ICD-10-CM

## 2016-12-02 LAB — LIPID PANEL
CHOLESTEROL: 149 mg/dL (ref 0–200)
HDL: 47.7 mg/dL (ref >39.00)
LDL Cholesterol: 82 mg/dL (ref 0–99)
NonHDL: 100.96
Total CHOL/HDL Ratio: 3
Triglycerides: 94 mg/dL (ref 0.0–149.0)
VLDL: 18.8 mg/dL (ref 0.0–40.0)

## 2016-12-02 LAB — BASIC METABOLIC PANEL
BUN: 9 mg/dL (ref 6–23)
CO2: 30 meq/L (ref 19–32)
CREATININE: 0.76 mg/dL (ref 0.40–1.20)
Calcium: 9.1 mg/dL (ref 8.4–10.5)
Chloride: 104 mEq/L (ref 96–112)
GFR: 88.23 mL/min (ref >60.00)
GLUCOSE: 82 mg/dL (ref 70–99)
Potassium: 4.1 mEq/L (ref 3.5–5.1)
Sodium: 138 mEq/L (ref 135–145)

## 2016-12-02 LAB — CBC
HCT: 39.5 % (ref 36.0–46.0)
HEMOGLOBIN: 13.2 g/dL (ref 12.0–15.0)
MCHC: 33.4 g/dL (ref 30.0–36.0)
MCV: 89.6 fl (ref 78.0–100.0)
PLATELETS: 279 10*3/uL (ref 150.0–400.0)
RBC: 4.41 Mil/uL (ref 3.87–5.11)
RDW: 12.7 % (ref 11.5–15.5)
WBC: 5.4 10*3/uL (ref 4.0–10.5)

## 2016-12-02 LAB — HEMOGLOBIN A1C: HEMOGLOBIN A1C: 5.5 % (ref 4.6–6.5)

## 2016-12-02 NOTE — Patient Instructions (Signed)
BEFORE YOU LEAVE: -follow up: yearly and as needed -lab  Vit D3 [phone] IU daily (cosco brand)  B12 1036mcg sublingual a few days per week.  We have ordered labs or studies at this visit. It can take up to 1-2 weeks for results and processing. IF results require follow up or explanation, we will call you with instructions. Clinically stable results will be released to your [facility]. If you have not heard from [place] or cannot find your results in [facility] in 2 weeks please contact our office at [phone].   If you are not yet signed up for [facility], please SIGN UP TODAY. We now offer online scheduling, same day appointments and extended hours. WHEN YOU DON'T FEEL YOUR BEST.Marland KitchenMarland KitchenWE ARE HERE TO HELP.   We recommend the following healthy lifestyle for LIFE: 1) Small portions.   Tip: eat off of a salad plate instead of a dinner plate.  Tip: if you need more or a snack choose fruits, veggies and/or a handful of nuts or seeds.  2) Eat a healthy clean diet.  * Tip: Avoid (less then 1 serving per week): processed foods, sweets, sweetened drinks, white starches (rice, flour, bread, potatoes, pasta, etc), red meat, fast foods, butter  *Tip: CHOOSE instead   * 5-9 servings per day of fresh or frozen fruits and vegetables (but not corn, potatoes, bananas, canned or dried fruit)   *nuts and seeds, beans   *olives and olive oil   *small portions of lean meats such as fish and white chicken    *small portions of whole grains  3)Get at least 150 minutes of sweaty aerobic exercise per week.  4)Reduce stress - consider counseling, meditation and relaxation to balance other aspects of your life.

## 2016-12-02 NOTE — Progress Notes (Signed)
Pre visit review using our clinic review tool, if applicable. No additional management support is needed unless otherwise documented below in the visit note. 

## 2016-12-02 NOTE — Addendum Note (Signed)
Addended by: Elmer Picker on: 12/02/2016 12:09 PM   Modules accepted: Orders

## 2017-02-07 ENCOUNTER — Telehealth: Payer: Self-pay | Admitting: *Deleted

## 2017-02-07 ENCOUNTER — Other Ambulatory Visit: Payer: Self-pay | Admitting: Obstetrics

## 2017-02-07 DIAGNOSIS — Z30011 Encounter for initial prescription of contraceptive pills: Secondary | ICD-10-CM

## 2017-02-07 MED ORDER — NORETHIN ACE-ETH ESTRAD-FE 1-20 MG-MCG(24) PO TABS: 1.0000 | ORAL_TABLET | Freq: Every day | ORAL | 3 refills | Status: DC

## 2017-02-07 NOTE — Telephone Encounter (Signed)
Fax received from [facility] for refill and 90 day supply of pt BC, Blisovi 24 FE tabs 28's 1/20.  Please send refills if approved to [facility]

## 2017-02-18 ENCOUNTER — Telehealth: Payer: Self-pay | Admitting: *Deleted

## 2017-02-18 NOTE — Telephone Encounter (Signed)
Pt called in and stated Dr Jodi Mourning sent her prescription to [facility] for her Birth control but he sent the wrong one, and she does not wish to change her birth control and wants a prescription for the same birth control sent to [facility].

## 2017-02-19 ENCOUNTER — Other Ambulatory Visit: Payer: Self-pay | Admitting: *Deleted

## 2017-02-19 DIAGNOSIS — Z30011 Encounter for initial prescription of contraceptive pills: Secondary | ICD-10-CM

## 2017-02-19 MED ORDER — BLISOVI 24 FE 1-20 MG-MCG(24) PO TABS: 1.0000 | ORAL_TABLET | Freq: Every day | ORAL | 3 refills | Status: DC

## 2017-03-04 ENCOUNTER — Telehealth: Payer: Self-pay

## 2017-03-04 NOTE — Telephone Encounter (Signed)
Returned call, pt had questions about BC pills and spotting.

## 2017-05-22 ENCOUNTER — Ambulatory Visit: Payer: Self-pay | Admitting: Obstetrics

## 2017-06-10 ENCOUNTER — Ambulatory Visit: Payer: Managed Care, Other (non HMO) | Admitting: Obstetrics

## 2017-07-03 ENCOUNTER — Encounter: Payer: Self-pay | Admitting: Obstetrics

## 2017-07-03 ENCOUNTER — Ambulatory Visit (INDEPENDENT_AMBULATORY_CARE_PROVIDER_SITE_OTHER): Payer: Managed Care, Other (non HMO) | Admitting: Obstetrics

## 2017-07-03 VITALS — BP 120/75 | HR 91 | Wt 137.0 lb

## 2017-07-03 DIAGNOSIS — N943 Premenstrual tension syndrome: Secondary | ICD-10-CM

## 2017-07-03 DIAGNOSIS — Z124 Encounter for screening for malignant neoplasm of cervix: Secondary | ICD-10-CM | POA: Diagnosis not present

## 2017-07-03 DIAGNOSIS — Z30011 Encounter for initial prescription of contraceptive pills: Secondary | ICD-10-CM

## 2017-07-03 DIAGNOSIS — N946 Dysmenorrhea, unspecified: Secondary | ICD-10-CM

## 2017-07-03 DIAGNOSIS — Z1151 Encounter for screening for human papillomavirus (HPV): Secondary | ICD-10-CM | POA: Diagnosis not present

## 2017-07-03 DIAGNOSIS — Z01419 Encounter for gynecological examination (general) (routine) without abnormal findings: Secondary | ICD-10-CM

## 2017-07-03 DIAGNOSIS — A6004 Herpesviral vulvovaginitis: Secondary | ICD-10-CM

## 2017-07-03 MED ORDER — BLISOVI 24 FE 1-20 MG-MCG(24) PO TABS: 1.0000 | ORAL_TABLET | Freq: Every day | ORAL | 3 refills | Status: DC

## 2017-07-03 MED ORDER — VALACYCLOVIR HCL 500 MG PO TABS: 500.0000 mg | ORAL_TABLET | Freq: Two times a day (BID) | ORAL | 5 refills | Status: DC

## 2017-07-03 MED ORDER — IBUPROFEN 800 MG PO TABS: 800.0000 mg | ORAL_TABLET | Freq: Three times a day (TID) | ORAL | 5 refills | Status: DC | PRN

## 2017-07-03 NOTE — Progress Notes (Signed)
Subjective:        Misty Flynn is a 46 y.o. female here for a routine exam.  Current complaints: Irregular cycles when off OCP's.  The OCP's also seem to help with PMS..    Personal health questionnaire:  Is patient Misty Flynn Jewish, have a family history of breast and/or ovarian cancer: no Is there a family history of uterine cancer diagnosed at age < 67, gastrointestinal cancer, urinary tract cancer, family member who is a Field seismologist syndrome-associated carrier: no Is the patient overweight and hypertensive, family history of diabetes, personal history of gestational diabetes, preeclampsia or PCOS: no Is patient over 55, have PCOS,  family history of premature CHD under age 49, diabetes, smoke, have hypertension or peripheral artery disease:  no At any time, has a partner hit, kicked or otherwise hurt or frightened you?: no Over the past 2 weeks, have you felt down, depressed or hopeless?: no Over the past 2 weeks, have you felt little interest or pleasure in doing things?:no   Gynecologic History Patient's last menstrual period was 06/23/2017 (approximate). Contraception: OCP (estrogen/progesterone) Last Pap: 2017. Results were: normal Last mammogram: unknown. Results were: unknown  Obstetric History OB History  Gravida Para Term Preterm AB Living  3 0 0   2 1  SAB TAB Ectopic Multiple Live Births    2          # Outcome Date GA Lbr Len/2nd Weight Sex Delivery Anes PTL Lv  3 TAB           2 Gravida      CS-LTranv     1 TAB               Past Medical History:  Diagnosis Date  . History of chicken pox   . HSV (herpes simplex virus) infection     Past Surgical History:  Procedure Laterality Date  . CESAREAN SECTION    . HERNIA REPAIR       Current Outpatient Prescriptions:  .  ibuprofen (ADVIL,MOTRIN) 800 MG tablet, Take 1 tablet (800 mg total) by mouth every 8 (eight) hours as needed for cramping., Disp: 30 tablet, Rfl: 5 .  valACYclovir (VALTREX) 500 MG tablet,  Take 1 tablet (500 mg total) by mouth 2 (two) times daily. With symptoms of outbreak., Disp: 30 tablet, Rfl: 5 .  BLISOVI 24 FE 1-20 MG-MCG(24) tablet, Take 1 tablet by mouth daily., Disp: 84 tablet, Rfl: 3 .  Cyanocobalamin (B-12 PO), Take 1 tablet by mouth daily., Disp: , Rfl:  No Known Allergies  Social History  Substance Use Topics  . Smoking status: Never Smoker  . Smokeless tobacco: Never Used  . Alcohol use No    Family History  Problem Relation Age of Onset  . Diabetes Maternal Grandmother   . Cancer Maternal Grandfather        prostate  . Cancer - Prostate Father       Review of Systems  Constitutional: negative for fatigue and weight loss Respiratory: negative for cough and wheezing Cardiovascular: negative for chest pain, fatigue and palpitations Gastrointestinal: negative for abdominal pain and change in bowel habits Musculoskeletal:negative for myalgias Neurological: negative for gait problems and tremors Behavioral/Psych: negative for abusive relationship, depression Endocrine: negative for temperature intolerance    Genitourinary:negative for abnormal menstrual periods, genital lesions, hot flashes, sexual problems and vaginal discharge Integument/breast: negative for breast lump, breast tenderness, nipple discharge and skin lesion(s)    Objective:       BP 120/75  Pulse 91   Wt 137 lb (62.1 kg)   LMP 06/23/2017 (Approximate)   BMI 25.06 kg/m  General:   alert  Skin:   no rash or abnormalities  Lungs:   clear to auscultation bilaterally  Heart:   regular rate and rhythm, S1, S2 normal, no murmur, click, rub or gallop  Breasts:   normal without suspicious masses, skin or nipple changes or axillary nodes  Abdomen:  normal findings: no organomegaly, soft, non-tender and no hernia  Pelvis:  External genitalia: normal general appearance Urinary system: urethral meatus normal and bladder without fullness, nontender Vaginal: normal without tenderness,  induration or masses Cervix: normal appearance Adnexa: normal bimanual exam Uterus: anteverted and non-tender, normal size   Lab Review Urine pregnancy test Labs reviewed yes Radiologic studies reviewed yes  50% of 20 min visit spent on counseling and coordination of care.    Assessment:     1. Encounter for gynecological examination with Papanicolaou smear of cervix Rx: - Cytology - PAP - Cervicovaginal ancillary only  2. Dysmenorrhea Rx: - ibuprofen (ADVIL,MOTRIN) 800 MG tablet; Take 1 tablet (800 mg total) by mouth every 8 (eight) hours as needed for cramping.  Dispense: 30 tablet; Refill: 5  3. PMS (premenstrual syndrome) - declines medical therapy, but will consider treatment options - educational material dispensed  4. Encounter for initial prescription of contraceptive pills Rx: - BLISOVI 24 FE 1-20 MG-MCG(24) tablet; Take 1 tablet by mouth daily.  Dispense: 84 tablet; Refill: 3  5. Herpes simplex vulvovaginitis Rx: - valACYclovir (VALTREX) 500 MG tablet; Take 1 tablet (500 mg total) by mouth 2 (two) times daily. With symptoms of outbreak.  Dispense: 30 tablet; Refill: 5   Plan:    Education reviewed: calcium supplements, depression evaluation, low fat, low cholesterol diet, safe sex/STD prevention, self breast exams and weight bearing exercise. Contraception: OCP (estrogen/progesterone). Mammogram ordered. Follow up in: 1 year.   Meds ordered this encounter  Medications  . BLISOVI 24 FE 1-20 MG-MCG(24) tablet    Sig: Take 1 tablet by mouth daily.    Dispense:  84 tablet    Refill:  3  . ibuprofen (ADVIL,MOTRIN) 800 MG tablet    Sig: Take 1 tablet (800 mg total) by mouth every 8 (eight) hours as needed for cramping.    Dispense:  30 tablet    Refill:  5  . valACYclovir (VALTREX) 500 MG tablet    Sig: Take 1 tablet (500 mg total) by mouth 2 (two) times daily. With symptoms of outbreak.    Dispense:  30 tablet    Refill:  5   No orders of the defined  types were placed in this encounter.

## 2017-07-03 NOTE — Patient Instructions (Addendum)
Premenstrual Syndrome Premenstrual syndrome (PMS) is a group of physical, emotional, and behavioral symptoms that affect women of childbearing age. PMS starts 1-2 weeks before the start of a woman's period and goes away a few days after the period starts. It often recurs in a predictable pattern. PMS can range from mild to severe. When it is severe, it is called premenstrual dysphoric disorder (PMDD). PMS can interfere in many ways with normal daily activities. What are the causes? The cause of this condition is not known, but it seems to be related to hormone changes that happen before menstruation. What are the signs or symptoms? Symptoms of this condition often happen every month. They go away completely after your period starts. Physical symptoms include:  Bloating.  Breast pain.  Headaches.  Extreme fatigue.  Backaches.  Swelling of the hands and feet.  Weight gain.  Hot flashes.  Emotional and behavioral symptoms include:  Mood swings.  Depression.  Angry outbursts.  Irritability.  Anxiety.  Crying spells.  Food cravings or appetite changes.  Changes in sexual desire.  Confusion.  Aggression.  Social withdrawal.  Poor concentration.  How is this diagnosed? This condition is diagnosed if symptoms of PMS:  Are present in the 5 days before your period starts.  End within 4 days after your period starts.  Happen at least 3 months in a row.  Interfere with some of your normal activities.  Other conditions that can cause some of these symptoms must be ruled out before PMS can be diagnosed. How is this treated? This condition may be treated by:  Maintaining a healthy lifestyle. This includes eating a balanced diet and exercising regularly.  Taking medicines. Medicines can help relieve symptoms such as cramps, aches, pains, headaches, and breast tenderness. Depending on the severity of the condition, your health care provider may  recommend: ? Over-the-counter pain medicines. ? Prescription medicines for PMDD.  Follow these instructions at home: Eating and drinking   Eat a well-balanced diet.  Avoid caffeine and alcohol.  Limit the amount of salt and salty foods you eat. This will help lessen bloating.  Drink enough fluid to keep your urine clear or pale yellow.  Take a multivitamin if told to by your health care provider. Lifestyle  Do not use any tobacco products, such as cigarettes, chewing tobacco, and e-cigarettes. If you need help quitting, ask your health care provider.  Exercise regularly as suggested by your health care provider.  Get enough sleep.  Practice relaxation techniques.  Limit stress. Other Instructions  For 2-3 months, write down your symptoms, their severity, and how long they last. This will help your health care provider choose the best treatment for you.  Take over-the-counter and prescription medicines only as told by your health care provider.  If you are using oral contraceptive pills, use them as told by your health care provider. This information is not intended to replace advice given to you by your health care provider. Make sure you discuss any questions you have with your health care provider. Document Released: 10/11/2000 Document Revised: 11/15/2015 Document Reviewed: 07/14/2015 Elsevier Interactive Patient Education  2018 Elsevier Inc.  

## 2017-07-03 NOTE — Progress Notes (Signed)
Patient is in the office today for annual exam. Patient was using OCP to control her PMS symptoms- cramping and flow. (Patient's partner has vasectomy)

## 2017-07-04 LAB — CERVICOVAGINAL ANCILLARY ONLY
BACTERIAL VAGINITIS: NEGATIVE
CANDIDA VAGINITIS: NEGATIVE

## 2017-07-07 LAB — CYTOLOGY - PAP
Diagnosis: NEGATIVE
HPV (WINDOPATH): NOT DETECTED

## 2017-09-11 ENCOUNTER — Telehealth: Payer: Self-pay | Admitting: Obstetrics

## 2017-09-11 ENCOUNTER — Other Ambulatory Visit: Payer: Self-pay

## 2017-09-11 DIAGNOSIS — Z30011 Encounter for initial prescription of contraceptive pills: Secondary | ICD-10-CM

## 2017-09-11 MED ORDER — NORETHIN ACE-ETH ESTRAD-FE 1-20 MG-MCG(24) PO TABS: 1.0000 | ORAL_TABLET | Freq: Every day | ORAL | 3 refills | Status: DC

## 2017-09-11 MED ORDER — BLISOVI 24 FE 1-20 MG-MCG(24) PO TABS: 1.0000 | ORAL_TABLET | Freq: Every day | ORAL | 3 refills | Status: DC

## 2017-09-11 NOTE — Telephone Encounter (Signed)
Patient called and requested that her OCP's be sent to her mail order pharmacy.  Rout[facility].

## 2017-09-11 NOTE — Telephone Encounter (Signed)
Returned call and pt stated that she would like her BC pills sent through [facility] because it costs too much at the pharmacy, advised that I would route to provider for review.

## 2017-09-24 ENCOUNTER — Encounter: Payer: Self-pay | Admitting: *Deleted

## 2018-03-03 ENCOUNTER — Encounter: Payer: Managed Care, Other (non HMO) | Admitting: Family Medicine

## 2018-03-31 ENCOUNTER — Encounter: Payer: Self-pay | Admitting: Family Medicine

## 2018-03-31 ENCOUNTER — Ambulatory Visit (INDEPENDENT_AMBULATORY_CARE_PROVIDER_SITE_OTHER): Payer: Managed Care, Other (non HMO) | Admitting: Family Medicine

## 2018-03-31 ENCOUNTER — Encounter

## 2018-03-31 VITALS — BP 120/48 | HR 76 | Temp 98.5°F | Ht 62.25 in | Wt 139.3 lb

## 2018-03-31 DIAGNOSIS — R109 Unspecified abdominal pain: Secondary | ICD-10-CM

## 2018-03-31 DIAGNOSIS — Z0000 Encounter for general adult medical examination without abnormal findings: Secondary | ICD-10-CM

## 2018-03-31 DIAGNOSIS — Z1331 Encounter for screening for depression: Secondary | ICD-10-CM | POA: Diagnosis not present

## 2018-03-31 LAB — COMPREHENSIVE METABOLIC PANEL
ALT: 7 U/L (ref 0–35)
AST: 14 U/L (ref 0–37)
Albumin: 4.3 g/dL (ref 3.5–5.2)
Alkaline Phosphatase: 38 U/L — ABNORMAL LOW (ref 39–117)
BILIRUBIN TOTAL: 0.5 mg/dL (ref 0.2–1.2)
BUN: 8 mg/dL (ref 6–23)
CALCIUM: 9.3 mg/dL (ref 8.4–10.5)
CHLORIDE: 103 meq/L (ref 96–112)
CO2: 29 meq/L (ref 19–32)
Creatinine, Ser: 0.77 mg/dL (ref 0.40–1.20)
GFR: 86.38 mL/min (ref >60.00)
Glucose, Bld: 76 mg/dL (ref 70–99)
Potassium: 4.1 mEq/L (ref 3.5–5.1)
Sodium: 139 mEq/L (ref 135–145)
Total Protein: 6.5 g/dL (ref 6.0–8.3)

## 2018-03-31 LAB — CBC
HCT: 38.3 % (ref 36.0–46.0)
HEMOGLOBIN: 12.8 g/dL (ref 12.0–15.0)
MCHC: 33.4 g/dL (ref 30.0–36.0)
MCV: 88.7 fl (ref 78.0–100.0)
PLATELETS: 283 10*3/uL (ref 150.0–400.0)
RBC: 4.32 Mil/uL (ref 3.87–5.11)
RDW: 13 % (ref 11.5–15.5)
WBC: 5.4 10*3/uL (ref 4.0–10.5)

## 2018-03-31 LAB — HEMOGLOBIN A1C: Hgb A1c MFr Bld: 5.6 % (ref 4.6–6.5)

## 2018-03-31 NOTE — Progress Notes (Signed)
HPI:  Using dictation device. Unfortunately this device frequently misinterprets words/phrases.  Here for CPE:  -Concerns and/or follow up today:   Sees gyn for heavy menstrual bleeding. Hx mild iron def anemia and mildly low b12. Labs at last visit 11/2016 looked good.  Reports doing well for the most part.   New concern - Some abd pain about 1x per week for a few months. Seems to be related to meals. Not eating healthy. No regular exercise. Also, symptoms and nausea are sometimes triggered by doubling up on her birth control pill when she misses a dose. She takes this to regulate her periods and her anemia thought 2ndary to menstrual losses improved on ocps.  Pain is in the lower abd region. Denies melena, constipation, diarrhea, hematochezia, vomiting, wt loss.  -Diet: variety of foods, balance and well rounded, larger portion sizes -Exercise: no regular exercise -Taking folic acid, vitamin D or calcium: no -Diabetes and Dyslipidemia Screening: last year looked good -Vaccines: see vaccine section EPIC -pap history: sees Dr. Jodi Mourning, pap 06/2017 -FDLMP: see nursing notes -sexual activity: yes, female partner, no new partners -wants STI testing (Hep C if born [birth year]-65): no -FH breast, colon or ovarian ca: see FH Last mammogram: does with gyn Last colon cancer screening: n/a Breast Ca Risk Assessment: see family history and pt history DEXA (>/= 65): n/a  -Alcohol, Tobacco, drug use: see social history  Review of Systems - no fevers, unintentional weight loss, vision loss, hearing loss, chest pain, sob, hemoptysis, melena, hematochezia, hematuria, genital discharge, changing or concerning skin lesions, bleeding, bruising, loc, thoughts of self harm or SI  Past Medical History:  Diagnosis Date  . History of chicken pox   . HSV (herpes simplex virus) infection     Past Surgical History:  Procedure Laterality Date  . CESAREAN SECTION    . HERNIA REPAIR      Family History   Problem Relation Age of Onset  . Diabetes Maternal Grandmother   . Cancer Maternal Grandfather        prostate  . Cancer - Prostate Father     Social History   Socioeconomic History  . Marital status: Unknown    Spouse name: Not on file  . Number of children: Not on file  . Years of education: Not on file  . Highest education level: Not on file  Occupational History  . Not on file  Social Needs  . Financial resource strain: Not on file  . Food insecurity:    Worry: Not on file    Inability: Not on file  . Transportation needs:    Medical: Not on file    Non-medical: Not on file  Tobacco Use  . Smoking status: Never Smoker  . Smokeless tobacco: Never Used  Substance and Sexual Activity  . Alcohol use: No    Alcohol/week: 0.0 oz  . Drug use: No  . Sexual activity: Yes    Partners: Male    Birth control/protection: Pill    Comment: partner has vasectomy  Lifestyle  . Physical activity:    Days per week: Not on file    Minutes per session: Not on file  . Stress: Not on file  Relationships  . Social connections:    Talks on phone: Not on file    Gets together: Not on file    Attends religious service: Not on file    Active member of club or organization: Not on file    Attends meetings of clubs  or organizations: Not on file    Relationship status: Not on file  Other Topics Concern  . 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  BLISOVI 24 FE 1-20 MG-MCG(24) tablet, Take 1 tablet daily by mouth., Disp: 84 tablet, Rfl: 3 .  Cyanocobalamin (B-12 PO), Take 1 tablet by mouth daily., Disp: , Rfl:  .  ibuprofen (ADVIL,MOTRIN) 800 MG tablet, Take 1 tablet (800 mg total) by mouth every 8 (eight) hours as needed for cramping., Disp: 30 tablet, Rfl: 5 .   Norethindrone Acetate-Ethinyl Estrad-FE (BLISOVI 24 FE) 1-20 MG-MCG(24) tablet, Take 1 tablet daily by mouth., Disp: 84 tablet, Rfl: 3 .  valACYclovir (VALTREX) 500 MG tablet, Take 1 tablet (500 mg total) by mouth 2 (two) times daily. With symptoms of outbreak., Disp: 30 tablet, Rfl: 5  EXAM:  Vitals:   03/31/18 1314  BP: (!) 120/48  Pulse: 76  Temp: 98.5 F (36.9 C)    GENERAL: vitals reviewed and listed below, alert, oriented, appears well hydrated and in no acute distress  HEENT: head atraumatic, PERRLA, normal appearance of eyes, ears, nose and mouth. moist mucus membranes.  NECK: supple, no masses or lymphadenopathy  LUNGS: clear to auscultation bilaterally, no rales, rhonchi or wheeze  CV: HRRR, no peripheral edema or cyanosis, normal pedal pulses  ABDOMEN: bowel sounds normal, soft, non tender to palpation, no masses, no rebound or guarding  GU/BREAST: sees gyn  SKIN: no rash or abnormal lesions  MS: normal gait, moves all extremities normally  NEURO: normal gait, speech and thought processing grossly intact, muscle tone grossly intact throughout  PSYCH: normal affect, pleasant and cooperative  ASSESSMENT AND PLAN:  Discussed the following assessment and plan:  PREVENTIVE EXAM: -Discussed and advised all [place] preventive services health task force level A and B recommendations for age, sex and risks. -Advised at least 150 minutes of exercise per week and a healthy diet with avoidance of (less then 1 serving per week) processed foods, white starches, red meat, fast foods and sweets and consisting of: * 5-9 servings of fresh fruits and vegetables (not corn or potatoes) *nuts and seeds, beans *olives and olive oil *lean meats such as fish and white chicken  *whole grains -labs, studies and vaccines per orders this encounter  Screening for depression - see phq9  Abdominal pain, unspecified abdominal location - Plan: CBC, Celiac Disease Comprehensive Panel with  Reflexes, Comprehensive metabolic panel, Hemoglobin A1c -labs -advised healthy diet and regular exercise -she reports may be sensitive to dairy so advised could try trial off dairy for a few weeks -follow up in 1 month, GI referral if not improving  -sooner as needed   Patient advised to return to clinic immediately if symptoms worsen or persist or new concerns.  Patient Instructions  BEFORE YOU LEAVE: -labs -follow up: 1 month  Eat a healthy diet and get regular exercise.  We recommend the following healthy lifestyle for LIFE: 1) Small portions. But, make sure to get regular (at least 3 per day), healthy meals and small healthy snacks if needed.  2) Eat a healthy clean diet.   TRY TO EAT: -at least 5-7 servings of low sugar, colorful, and nutrient rich vegetables per  day (not corn, potatoes or bananas.) -berries are the best choice if you wish to eat fruit (only eat small amounts if trying to reduce weight)  -lean meets (fish, white meat of chicken or [place]) -vegan proteins for some meals - beans or tofu, whole grains, nuts and seeds -Replace bad fats with good fats - good fats include: fish, nuts and seeds, canola oil, olive oil -small amounts of low fat or non fat dairy -small amounts of100 % whole grains - check the lables -drink plenty of water  AVOID: -SUGAR, sweets, anything with added sugar, corn syrup or sweeteners - must read labels as even foods advertised as "healthy" often are loaded with sugar -if you must have a sweetener, small amounts of stevia may be best -sweetened beverages and artificially sweetened beverages -simple starches (rice, bread, potatoes, pasta, chips, etc - small amounts of 100% whole grains are ok) -red meat, pork, butter -fried foods, fast food, processed food, excessive dairy, eggs and coconut.  3)Get at least 150 minutes of sweaty aerobic exercise per week.  4)Reduce stress - consider counseling, meditation and relaxation to balance  other aspects of your life.       No follow-ups on file.  Misty Kern, DO

## 2018-03-31 NOTE — Patient Instructions (Addendum)
BEFORE YOU LEAVE: -labs -follow up: 1 month  Eat a healthy diet and get regular exercise.  We recommend the following healthy lifestyle for LIFE: 1) Small portions. But, make sure to get regular (at least 3 per day), healthy meals and small healthy snacks if needed.  2) Eat a healthy clean diet.   TRY TO EAT: -at least 5-7 servings of low sugar, colorful, and nutrient rich vegetables per day (not corn, potatoes or bananas.) -berries are the best choice if you wish to eat fruit (only eat small amounts if trying to reduce weight)  -lean meets (fish, white meat of chicken or [place]) -vegan proteins for some meals - beans or tofu, whole grains, nuts and seeds -Replace bad fats with good fats - good fats include: fish, nuts and seeds, canola oil, olive oil -small amounts of low fat or non fat dairy -small amounts of100 % whole grains - check the lables -drink plenty of water  AVOID: -SUGAR, sweets, anything with added sugar, corn syrup or sweeteners - must read labels as even foods advertised as "healthy" often are loaded with sugar -if you must have a sweetener, small amounts of stevia may be best -sweetened beverages and artificially sweetened beverages -simple starches (rice, bread, potatoes, pasta, chips, etc - small amounts of 100% whole grains are ok) -red meat, pork, butter -fried foods, fast food, processed food, excessive dairy, eggs and coconut.  3)Get at least 150 minutes of sweaty aerobic exercise per week.  4)Reduce stress - consider counseling, meditation and relaxation to balance other aspects of your life.

## 2018-04-01 LAB — CELIAC DISEASE COMPREHENSIVE PANEL WITH REFLEXES
(tTG) Ab, IgA: 1 U/mL
Immunoglobulin A: 143 mg/dL (ref 81–463)

## 2018-07-06 ENCOUNTER — Encounter: Payer: Self-pay | Admitting: Obstetrics

## 2018-07-06 ENCOUNTER — Other Ambulatory Visit: Payer: Self-pay

## 2018-07-06 ENCOUNTER — Ambulatory Visit (INDEPENDENT_AMBULATORY_CARE_PROVIDER_SITE_OTHER): Payer: Managed Care, Other (non HMO) | Admitting: Obstetrics

## 2018-07-06 VITALS — BP 108/67 | HR 79 | Ht 62.0 in | Wt 140.8 lb

## 2018-07-06 DIAGNOSIS — N898 Other specified noninflammatory disorders of vagina: Secondary | ICD-10-CM

## 2018-07-06 DIAGNOSIS — Z01419 Encounter for gynecological examination (general) (routine) without abnormal findings: Secondary | ICD-10-CM

## 2018-07-06 DIAGNOSIS — Z1239 Encounter for other screening for malignant neoplasm of breast: Secondary | ICD-10-CM

## 2018-07-06 DIAGNOSIS — N946 Dysmenorrhea, unspecified: Secondary | ICD-10-CM

## 2018-07-06 DIAGNOSIS — Z1151 Encounter for screening for human papillomavirus (HPV): Secondary | ICD-10-CM

## 2018-07-06 DIAGNOSIS — Z124 Encounter for screening for malignant neoplasm of cervix: Secondary | ICD-10-CM | POA: Diagnosis not present

## 2018-07-06 DIAGNOSIS — N943 Premenstrual tension syndrome: Secondary | ICD-10-CM

## 2018-07-06 DIAGNOSIS — A6004 Herpesviral vulvovaginitis: Secondary | ICD-10-CM

## 2018-07-06 DIAGNOSIS — Z3041 Encounter for surveillance of contraceptive pills: Secondary | ICD-10-CM

## 2018-07-06 MED ORDER — VALACYCLOVIR HCL 500 MG PO TABS: 500.0000 mg | ORAL_TABLET | Freq: Two times a day (BID) | ORAL | 5 refills | Status: DC

## 2018-07-06 MED ORDER — IBUPROFEN 800 MG PO TABS: 800.0000 mg | ORAL_TABLET | Freq: Three times a day (TID) | ORAL | 5 refills | Status: DC | PRN

## 2018-07-06 MED ORDER — NORETHIN ACE-ETH ESTRAD-FE 1-20 MG-MCG(24) PO TABS: 1.0000 | ORAL_TABLET | Freq: Every day | ORAL | 3 refills | Status: DC

## 2018-07-06 NOTE — Progress Notes (Signed)
Presents for AEX. Last PAP 07/03/17.  Patient has requested a PAP Exam today.

## 2018-07-06 NOTE — Patient Instructions (Signed)
Premenstrual Syndrome Premenstrual syndrome (PMS) is a group of physical, emotional, and behavioral symptoms that affect women of childbearing age. PMS starts 1-2 weeks before the start of a woman's period and goes away a few days after the period starts. It often recurs in a predictable pattern. PMS can range from mild to severe. When it is severe, it is called premenstrual dysphoric disorder (PMDD). PMS can interfere in many ways with normal daily activities. What are the causes? The cause of this condition is not known, but it seems to be related to hormone changes that happen before menstruation. What are the signs or symptoms? Symptoms of this condition often happen every month. They go away completely after your period starts. Physical symptoms include:  Bloating.  Breast pain.  Headaches.  Extreme fatigue.  Backaches.  Swelling of the hands and feet.  Weight gain.  Hot flashes.  Emotional and behavioral symptoms include:  Mood swings.  Depression.  Angry outbursts.  Irritability.  Anxiety.  Crying spells.  Food cravings or appetite changes.  Changes in sexual desire.  Confusion.  Aggression.  Social withdrawal.  Poor concentration.  How is this diagnosed? This condition is diagnosed if symptoms of PMS:  Are present in the 5 days before your period starts.  End within 4 days after your period starts.  Happen at least 3 months in a row.  Interfere with some of your normal activities.  Other conditions that can cause some of these symptoms must be ruled out before PMS can be diagnosed. How is this treated? This condition may be treated by:  Maintaining a healthy lifestyle. This includes eating a balanced diet and exercising regularly.  Taking medicines. Medicines can help relieve symptoms such as cramps, aches, pains, headaches, and breast tenderness. Depending on the severity of the condition, your health care provider may  recommend: ? Over-the-counter pain medicines. ? Prescription medicines for PMDD.  Follow these instructions at home: Eating and drinking   Eat a well-balanced diet.  Avoid caffeine and alcohol.  Limit the amount of salt and salty foods you eat. This will help lessen bloating.  Drink enough fluid to keep your urine clear or pale yellow.  Take a multivitamin if told to by your health care provider. Lifestyle  Do not use any tobacco products, such as cigarettes, chewing tobacco, and e-cigarettes. If you need help quitting, ask your health care provider.  Exercise regularly as suggested by your health care provider.  Get enough sleep.  Practice relaxation techniques.  Limit stress. Other Instructions  For 2-3 months, write down your symptoms, their severity, and how long they last. This will help your health care provider choose the best treatment for you.  Take over-the-counter and prescription medicines only as told by your health care provider.  If you are using oral contraceptive pills, use them as told by your health care provider. This information is not intended to replace advice given to you by your health care provider. Make sure you discuss any questions you have with your health care provider. Document Released: 10/11/2000 Document Revised: 11/15/2015 Document Reviewed: 07/14/2015 Elsevier Interactive Patient Education  2018 Elsevier Inc.  

## 2018-07-06 NOTE — Progress Notes (Signed)
Subjective:        Misty Flynn is a 47 y.o. female here for a routine exam.  Current complaints: Mild nausea with OCP's.  Continues to have PMS symptoms but refuses medication..    Personal health questionnaire:  Is patient [name], have a family history of breast and/or ovarian cancer: no Is there a family history of uterine cancer diagnosed at age < 21, gastrointestinal cancer, urinary tract cancer, family member who is a Field seismologist syndrome-associated carrier: no Is the patient overweight and hypertensive, family history of diabetes, personal history of gestational diabetes, preeclampsia or PCOS: no Is patient over 27, have PCOS,  family history of premature CHD under age 63, diabetes, smoke, have hypertension or peripheral artery disease:  no At any time, has a partner hit, kicked or otherwise hurt or frightened you?: no Over the past 2 weeks, have you felt down, depressed or hopeless?: no Over the past 2 weeks, have you felt little interest or pleasure in doing things?:no   Gynecologic History Patient's last menstrual period was 06/29/2018. Contraception: OCP (estrogen/progesterone) Last Pap: 2018. Results were: normal Last mammogram: 2018. Results were: normal  Obstetric History OB History  Gravida Para Term Preterm AB Living  3 0 0   2 1  SAB TAB Ectopic Multiple Live Births    2          # Outcome Date GA Lbr Len/2nd Weight Sex Delivery Anes PTL Lv  3 TAB           2 Gravida      CS-LTranv     1 TAB             Past Medical History:  Diagnosis Date  . History of chicken pox   . HSV (herpes simplex virus) infection     Past Surgical History:  Procedure Laterality Date  . CESAREAN SECTION    . HERNIA REPAIR       Current Outpatient Medications:  .  BLISOVI 24 FE 1-20 MG-MCG(24) tablet, Take 1 tablet daily by mouth., Disp: 84 tablet, Rfl: 3 .  Cyanocobalamin (B-12 PO), Take 1 tablet by mouth daily., Disp: , Rfl:  .  ibuprofen (ADVIL,MOTRIN) 800 MG  tablet, Take 1 tablet (800 mg total) by mouth every 8 (eight) hours as needed for cramping., Disp: 30 tablet, Rfl: 5 .  Norethindrone Acetate-Ethinyl Estrad-FE (BLISOVI 24 FE) 1-20 MG-MCG(24) tablet, Take 1 tablet by mouth daily., Disp: 84 tablet, Rfl: 3 .  valACYclovir (VALTREX) 500 MG tablet, Take 1 tablet (500 mg total) by mouth 2 (two) times daily. With symptoms of outbreak., Disp: 30 tablet, Rfl: 5 No Known Allergies  Social History   Tobacco Use  . Smoking status: Never Smoker  . Smokeless tobacco: Never Used  Substance Use Topics  . Alcohol use: No    Alcohol/week: 0.0 standard drinks    Family History  Problem Relation Age of Onset  . Diabetes Maternal Grandmother   . Cancer Maternal Grandfather        prostate  . Cancer - Prostate Father       Review of Systems  Constitutional: negative for fatigue and weight loss Respiratory: negative for cough and wheezing Cardiovascular: negative for chest pain, fatigue and palpitations Gastrointestinal: negative for abdominal pain and change in bowel habits Musculoskeletal:negative for myalgias Neurological: negative for gait problems and tremors Behavioral/Psych: negative for abusive relationship, depression Endocrine: negative for temperature intolerance    Genitourinary:negative for abnormal menstrual periods, genital lesions, hot flashes,  sexual problems and vaginal discharge Integument/breast: negative for breast lump, breast tenderness, nipple discharge and skin lesion(s)    Objective:       BP 108/67   Pulse 79   Ht 5\' 2"  (1.575 m)   Wt 140 lb 12.8 oz (63.9 kg)   LMP 06/29/2018   BMI 25.75 kg/m  General:   alert  Skin:   no rash or abnormalities  Lungs:   clear to auscultation bilaterally  Heart:   regular rate and rhythm, S1, S2 normal, no murmur, click, rub or gallop  Breasts:   normal without suspicious masses, skin or nipple changes or axillary nodes  Abdomen:  normal findings: no organomegaly, soft,  non-tender and no hernia  Pelvis:  External genitalia: normal general appearance Urinary system: urethral meatus normal and bladder without fullness, nontender Vaginal: normal without tenderness, induration or masses Cervix: normal appearance Adnexa: normal bimanual exam Uterus: anteverted and non-tender, normal size   Lab Review Urine pregnancy test Labs reviewed yes Radiologic studies reviewed yes  50% of 20 min visit spent on counseling and coordination of care.   Assessment:     1. Encounter for gynecological examination with Papanicolaou smear of cervix Rx: - Cytology - PAP  2. Vaginal discharge Rx: - Cervicovaginal ancillary only  3. Dysmenorrhea Rx: - ibuprofen (ADVIL,MOTRIN) 800 MG tablet; Take 1 tablet (800 mg total) by mouth every 8 (eight) hours as needed for cramping.  Dispense: 30 tablet; Refill: 5  4. PMS (premenstrual syndrome) - refuses treatment  5. Encounter for surveillance of contraceptive pills Rx: - Norethindrone Acetate-Ethinyl Estrad-FE (BLISOVI 24 FE) 1-20 MG-MCG(24) tablet; Take 1 tablet by mouth daily.  Dispense: 84 tablet; Refill: 3  6. Screening breast examination - mammogram has been scheduled  7. Herpes simplex vulvovaginitis Rx: - valACYclovir (VALTREX) 500 MG tablet; Take 1 tablet (500 mg total) by mouth 2 (two) times daily. With symptoms of outbreak.  Dispense: 30 tablet; Refill: 5    Plan:    Education reviewed: calcium supplements, depression evaluation, low fat, low cholesterol diet, safe sex/STD prevention, self breast exams and weight bearing exercise. Contraception: OCP (estrogen/progesterone). Follow up in: 1 year.   Meds ordered this encounter  Medications  . Norethindrone Acetate-Ethinyl Estrad-FE (BLISOVI 24 FE) 1-20 MG-MCG(24) tablet    Sig: Take 1 tablet by mouth daily.    Dispense:  84 tablet    Refill:  3  . ibuprofen (ADVIL,MOTRIN) 800 MG tablet    Sig: Take 1 tablet (800 mg total) by mouth every 8 (eight) hours  as needed for cramping.    Dispense:  30 tablet    Refill:  5  . valACYclovir (VALTREX) 500 MG tablet    Sig: Take 1 tablet (500 mg total) by mouth 2 (two) times daily. With symptoms of outbreak.    Dispense:  30 tablet    Refill:  5     Shelly Bombard MD 07-06-2018

## 2018-07-07 LAB — CERVICOVAGINAL ANCILLARY ONLY
Bacterial vaginitis: NEGATIVE
Candida vaginitis: NEGATIVE

## 2018-07-08 LAB — CYTOLOGY - PAP
Diagnosis: NEGATIVE
HPV: NOT DETECTED

## 2018-08-21 ENCOUNTER — Other Ambulatory Visit: Payer: Self-pay | Admitting: Obstetrics

## 2018-08-21 DIAGNOSIS — Z30011 Encounter for initial prescription of contraceptive pills: Secondary | ICD-10-CM

## 2019-03-12 ENCOUNTER — Telehealth: Payer: Self-pay | Admitting: *Deleted

## 2019-03-12 NOTE — Telephone Encounter (Signed)
Pt called with questions about her cycle. Spoke with pt and she states cycle has been heavier at night and she is wondering why. Pt states her LMP was 5/6 and is still bleeding.  Pt uses Blisovi for BC. Pt states she is not currently taking pills, she waits til her cycle ends to start new pack. Pt made aware this is incorrect way to take [facility] pills and may be why her bleeding has continued/changed. Pt advised of how to correctly take [facility] pills and advised to start new pack on Sunday and continue daily. Pt advised to monitor bleeding once she starts new pill pack and see if change in bleeding, if no change to call office.

## 2019-05-07 ENCOUNTER — Other Ambulatory Visit: Payer: Self-pay | Admitting: Obstetrics

## 2019-05-07 DIAGNOSIS — N946 Dysmenorrhea, unspecified: Secondary | ICD-10-CM

## 2019-08-15 ENCOUNTER — Other Ambulatory Visit: Payer: Self-pay | Admitting: Obstetrics

## 2019-08-15 DIAGNOSIS — Z3041 Encounter for surveillance of contraceptive pills: Secondary | ICD-10-CM

## 2019-08-18 ENCOUNTER — Ambulatory Visit: Payer: Managed Care, Other (non HMO) | Admitting: Obstetrics and Gynecology

## 2019-08-20 ENCOUNTER — Encounter: Payer: Self-pay | Admitting: Obstetrics

## 2019-08-20 ENCOUNTER — Ambulatory Visit (INDEPENDENT_AMBULATORY_CARE_PROVIDER_SITE_OTHER): Payer: BC Managed Care – PPO | Admitting: Obstetrics

## 2019-08-20 ENCOUNTER — Other Ambulatory Visit: Payer: Self-pay

## 2019-08-20 VITALS — BP 111/72 | HR 88 | Ht 62.0 in | Wt 141.0 lb

## 2019-08-20 DIAGNOSIS — Z1151 Encounter for screening for human papillomavirus (HPV): Secondary | ICD-10-CM

## 2019-08-20 DIAGNOSIS — Z01419 Encounter for gynecological examination (general) (routine) without abnormal findings: Secondary | ICD-10-CM

## 2019-08-20 DIAGNOSIS — Z3041 Encounter for surveillance of contraceptive pills: Secondary | ICD-10-CM

## 2019-08-20 DIAGNOSIS — Z0000 Encounter for general adult medical examination without abnormal findings: Secondary | ICD-10-CM

## 2019-08-20 DIAGNOSIS — A6004 Herpesviral vulvovaginitis: Secondary | ICD-10-CM

## 2019-08-20 DIAGNOSIS — N898 Other specified noninflammatory disorders of vagina: Secondary | ICD-10-CM

## 2019-08-20 DIAGNOSIS — N946 Dysmenorrhea, unspecified: Secondary | ICD-10-CM

## 2019-08-20 DIAGNOSIS — Z124 Encounter for screening for malignant neoplasm of cervix: Secondary | ICD-10-CM

## 2019-08-20 DIAGNOSIS — N6315 Unspecified lump in the right breast, overlapping quadrants: Secondary | ICD-10-CM

## 2019-08-20 MED ORDER — VITAFOL-ONE 29-1-200 MG PO CAPS: 1.0000 | ORAL_CAPSULE | Freq: Every day | ORAL | 4 refills | Status: DC

## 2019-08-20 MED ORDER — BLISOVI 24 FE 1-20 MG-MCG(24) PO TABS: 1.0000 | ORAL_TABLET | Freq: Every day | ORAL | 4 refills | Status: DC

## 2019-08-20 MED ORDER — VALACYCLOVIR HCL 500 MG PO TABS: 500.0000 mg | ORAL_TABLET | Freq: Two times a day (BID) | ORAL | 5 refills | Status: DC

## 2019-08-20 MED ORDER — IBUPROFEN 800 MG PO TABS: 800.0000 mg | ORAL_TABLET | Freq: Three times a day (TID) | ORAL | 5 refills | Status: DC | PRN

## 2019-08-20 NOTE — Progress Notes (Signed)
Subjective:        Misty Flynn is a 48 y.o. female here for a routine exam.  Current complaints: Irregular period, and just spotting this month.  On OCP's for cycle regulation.  Questions whether she is approaching menopause because her mother started menopause at 87 years of age.  She is starting to have mild hot flushes.  Personal health questionnaire:  Is patient [name], have a family history of breast and/or ovarian cancer: no Is there a family history of uterine cancer diagnosed at age < 62, gastrointestinal cancer, urinary tract cancer, family member who is a Field seismologist syndrome-associated carrier: no Is the patient overweight and hypertensive, family history of diabetes, personal history of gestational diabetes, preeclampsia or PCOS: no Is patient over 40, have PCOS,  family history of premature CHD under age 56, diabetes, smoke, have hypertension or peripheral artery disease:  no At any time, has a partner hit, kicked or otherwise hurt or frightened you?: no Over the past 2 weeks, have you felt down, depressed or hopeless?: no Over the past 2 weeks, have you felt little interest or pleasure in doing things?:no   Gynecologic History No LMP recorded. Contraception: OCP (estrogen/progesterone) Last Pap: 07-06-2018. Results were: normal Last mammogram: 11-22-.2019 Results were: fatty mass  Obstetric History OB History  Gravida Para Term Preterm AB Living  3 0 0   2 1  SAB TAB Ectopic Multiple Live Births    2          # Outcome Date GA Lbr Len/2nd Weight Sex Delivery Anes PTL Lv  3 TAB           2 Gravida      CS-LTranv     1 TAB             Past Medical History:  Diagnosis Date  . History of chicken pox   . HSV (herpes simplex virus) infection     Past Surgical History:  Procedure Laterality Date  . CESAREAN SECTION    . HERNIA REPAIR       Current Outpatient Medications:  .  BLISOVI 24 FE 1-20 MG-MCG(24) tablet, Take 1 tablet by mouth daily., Disp: 84  tablet, Rfl: 4 .  BLISOVI 24 FE 1-20 MG-MCG(24) tablet, TAKE 1 TABLET BY MOUTH EVERY DAY, Disp: 84 tablet, Rfl: 3 .  Cyanocobalamin (B-12 PO), Take 1 tablet by mouth daily., Disp: , Rfl:  .  ibuprofen (ADVIL) 800 MG tablet, Take 1 tablet (800 mg total) by mouth every 8 (eight) hours as needed for cramping., Disp: 30 tablet, Rfl: 5 .  Prenatal Vit-FePoly-FA-DHA (VITAFOL-ONE) 29-1-200 MG CAPS, Take 1 capsule by mouth daily before breakfast., Disp: 90 capsule, Rfl: 4 .  valACYclovir (VALTREX) 500 MG tablet, Take 1 tablet (500 mg total) by mouth 2 (two) times daily. With symptoms of outbreak., Disp: 30 tablet, Rfl: 5 No Known Allergies  Social History   Tobacco Use  . Smoking status: Never Smoker  . Smokeless tobacco: Never Used  Substance Use Topics  . Alcohol use: No    Alcohol/week: 0.0 standard drinks    Family History  Problem Relation Age of Onset  . Diabetes Maternal Grandmother   . Cancer Maternal Grandfather        prostate  . Cancer - Prostate Father       Review of Systems  Constitutional: negative for fatigue and weight loss Respiratory: negative for cough and wheezing Cardiovascular: negative for chest pain, fatigue and palpitations Gastrointestinal: negative  for abdominal pain and change in bowel habits Musculoskeletal:negative for myalgias Neurological: negative for gait problems and tremors Behavioral/Psych: negative for abusive relationship, depression Endocrine: negative for temperature intolerance    Genitourinary:positive for abnormal menstrual periods and mild hot flashes.  Negative for sexual problems and vaginal discharge Integument/breast: positive for breast lump - right breast    Objective:       BP 111/72   Pulse 88   Ht 5\' 2"  (1.575 m)   Wt 141 lb (64 kg)   BMI 25.79 kg/m  General:   alert and no distress  Skin:   no rash or abnormalities  Lungs:   clear to auscultation bilaterally  Heart:   regular rate and rhythm, S1, S2 normal, no murmur,  click, rub or gallop  Breasts:   normal without suspicious masses, skin or nipple changes or axillary nodes  Abdomen:  normal findings: no organomegaly, soft, non-tender and no hernia  Pelvis:  External genitalia: normal general appearance Urinary system: urethral meatus normal and bladder without fullness, nontender Vaginal: normal without tenderness, induration or masses Cervix: normal appearance Adnexa: normal bimanual exam Uterus: anteverted and non-tender, normal size   Lab Review Urine pregnancy test Labs reviewed yes Radiologic studies reviewed yes  50% of 25 min visit spent on counseling and coordination of care.   Assessment:     1. Encounter for gynecological examination with Papanicolaou smear of cervix Rx: - Cytology - PAP( [place])  2. Vaginal discharge Rx: - Cervicovaginal ancillary only( [place])  3. Dysmenorrhea Rx: - ibuprofen (ADVIL) 800 MG tablet; Take 1 tablet (800 mg total) by mouth every 8 (eight) hours as needed for cramping.  Dispense: 30 tablet; Refill: 5  4. Breast lump on right side at 9 o'clock position Rx: - US BREAST LTD UNI RIGHT INC AXILLA; Future - MM Digital Diagnostic Unilat R; Future  5. Herpes simplex vulvovaginitis Rx: - valACYclovir (VALTREX) 500 MG tablet; Take 1 tablet (500 mg total) by mouth 2 (two) times daily. With symptoms of outbreak.  Dispense: 30 tablet; Refill: 5  6. Encounter for surveillance of contraceptive pills Rx: - BLISOVI 24 FE 1-20 MG-MCG(24) tablet; Take 1 tablet by mouth daily.  Dispense: 84 tablet; Refill: 4  7. Routine adult health maintenance Rx: - Prenatal Vit-FePoly-FA-DHA (VITAFOL-ONE) 29-1-200 MG CAPS; Take 1 capsule by mouth daily before breakfast.  Dispense: 90 capsule; Refill: 4    Plan:    Education reviewed: calcium supplements, depression evaluation, low fat, low cholesterol diet, safe sex/STD prevention, self breast exams and weight bearing exercise. Contraception: vasectomy. Follow  up in: 1 year.   Meds ordered this encounter  Medications  . BLISOVI 24 FE 1-20 MG-MCG(24) tablet    Sig: Take 1 tablet by mouth daily.    Dispense:  84 tablet    Refill:  4  . ibuprofen (ADVIL) 800 MG tablet    Sig: Take 1 tablet (800 mg total) by mouth every 8 (eight) hours as needed for cramping.    Dispense:  30 tablet    Refill:  5  . valACYclovir (VALTREX) 500 MG tablet    Sig: Take 1 tablet (500 mg total) by mouth 2 (two) times daily. With symptoms of outbreak.    Dispense:  30 tablet    Refill:  5  . Prenatal Vit-FePoly-FA-DHA (VITAFOL-ONE) 29-1-200 MG CAPS    Sig: Take 1 capsule by mouth daily before breakfast.    Dispense:  90 capsule    Refill:  4  No orders of the defined types were placed in this encounter.  Shelly Bombard, MD 08/20/2019 10:07 AM

## 2019-08-24 LAB — CYTOLOGY - PAP
Comment: NEGATIVE
Diagnosis: NEGATIVE
High risk HPV: NEGATIVE

## 2019-08-25 LAB — CERVICOVAGINAL ANCILLARY ONLY
Bacterial Vaginitis (gardnerella): NEGATIVE
Candida Glabrata: NEGATIVE
Candida Vaginitis: NEGATIVE
Comment: NEGATIVE
Comment: NEGATIVE
Comment: NEGATIVE

## 2019-08-26 ENCOUNTER — Telehealth: Payer: Self-pay

## 2019-08-26 NOTE — Telephone Encounter (Signed)
Returned call, and advised of results.

## 2019-08-31 ENCOUNTER — Encounter: Payer: Self-pay | Admitting: Obstetrics

## 2019-08-31 DIAGNOSIS — N6313 Unspecified lump in the right breast, lower outer quadrant: Secondary | ICD-10-CM | POA: Diagnosis not present

## 2019-08-31 DIAGNOSIS — N6001 Solitary cyst of right breast: Secondary | ICD-10-CM | POA: Diagnosis not present

## 2019-10-06 ENCOUNTER — Encounter: Payer: Managed Care, Other (non HMO) | Admitting: Internal Medicine

## 2019-10-15 ENCOUNTER — Encounter: Payer: Managed Care, Other (non HMO) | Admitting: Internal Medicine

## 2019-11-12 ENCOUNTER — Telehealth: Payer: Self-pay | Admitting: *Deleted

## 2019-11-12 NOTE — Telephone Encounter (Signed)
Copied from [place] [id number]. Topic: General - Other >> Nov 12, 2019  3:07 PM Celene Kras wrote: Reason for CRM: Pt is requesting to be called if there are any cancellations for [facility] with Dr. Jerilee Hoh. Please advise.

## 2019-11-17 ENCOUNTER — Encounter: Payer: Managed Care, Other (non HMO) | Admitting: Internal Medicine

## 2020-01-26 ENCOUNTER — Ambulatory Visit: Payer: BC Managed Care – PPO | Admitting: Internal Medicine

## 2020-01-26 ENCOUNTER — Encounter: Payer: Self-pay | Admitting: Internal Medicine

## 2020-01-26 ENCOUNTER — Other Ambulatory Visit: Payer: Self-pay

## 2020-01-26 VITALS — BP 98/64 | HR 93 | Temp 97.3°F | Ht 62.0 in | Wt 137.3 lb

## 2020-01-26 DIAGNOSIS — D509 Iron deficiency anemia, unspecified: Secondary | ICD-10-CM | POA: Diagnosis not present

## 2020-01-26 DIAGNOSIS — Z1211 Encounter for screening for malignant neoplasm of colon: Secondary | ICD-10-CM

## 2020-01-26 DIAGNOSIS — A6000 Herpesviral infection of urogenital system, unspecified: Secondary | ICD-10-CM | POA: Diagnosis not present

## 2020-01-26 NOTE — Patient Instructions (Signed)
-  Nice seeing you today!!  -Schedule follow up for your physical as soon pas possible. Please come in fasting that day.  -Referral to GI for screening colonoscopy today.

## 2020-01-26 NOTE — Progress Notes (Signed)
Established Patient Office Visit     This visit occurred during the SARS-CoV-2 public health emergency.  Safety protocols were in place, including screening questions prior to the visit, additional usage of staff PPE, and extensive cleaning of exam room while observing appropriate contact time as indicated for disinfecting solutions.    CC/Reason for Visit: Transfer of care, discuss chronic conditions  HPI: Misty Flynn is a 49 y.o. female who is coming in today for the above mentioned reasons. Past Medical History is significant for: Iron deficiency anemia for which she takes daily iron supplementation, she also has a history of genital herpes for which she takes Valtrex when she has an outbreak.  Has not had an outbreak in a few years.  She follows with GYN who is in charge of her mammograms and Pap smears, her last mammogram was in 2020.  She is not due for screening colonoscopy.  She is scheduled for her first Covid vaccination on April the 12th.  She has no acute complaints today.   Past Medical/Surgical History: Past Medical History:  Diagnosis Date  . History of chicken pox   . HSV (herpes simplex virus) infection     Past Surgical History:  Procedure Laterality Date  . CESAREAN SECTION    . HERNIA REPAIR      Social History:  reports that she has never smoked. She has never used smokeless tobacco. She reports that she does not drink alcohol or use drugs.  Allergies: No Known Allergies  Family History:  Family History  Problem Relation Age of Onset  . Diabetes Maternal Grandmother   . Cancer Maternal Grandfather        prostate  . Cancer - Prostate Father      Current Outpatient Medications:  .  BLISOVI 24 FE 1-20 MG-MCG(24) tablet, TAKE 1 TABLET BY MOUTH EVERY DAY, Disp: 84 tablet, Rfl: 3 .  BLISOVI 24 FE 1-20 MG-MCG(24) tablet, Take 1 tablet by mouth daily., Disp: 84 tablet, Rfl: 4 .  Cyanocobalamin (B-12 PO), Take 1 tablet by mouth daily., Disp: , Rfl:    .  ibuprofen (ADVIL) 800 MG tablet, Take 1 tablet (800 mg total) by mouth every 8 (eight) hours as needed for cramping., Disp: 30 tablet, Rfl: 5 .  Prenatal Vit-FePoly-FA-DHA (VITAFOL-ONE) 29-1-200 MG CAPS, Take 1 capsule by mouth daily before breakfast., Disp: 90 capsule, Rfl: 4 .  valACYclovir (VALTREX) 500 MG tablet, Take 1 tablet (500 mg total) by mouth 2 (two) times daily. With symptoms of outbreak., Disp: 30 tablet, Rfl: 5  Review of Systems:  Constitutional: Denies fever, chills, diaphoresis, appetite change and fatigue.  HEENT: Denies photophobia, eye pain, redness, hearing loss, ear pain, congestion, sore throat, rhinorrhea, sneezing, mouth sores, trouble swallowing, neck pain, neck stiffness and tinnitus.   Respiratory: Denies SOB, DOE, cough, chest tightness,  and wheezing.   Cardiovascular: Denies chest pain, palpitations and leg swelling.  Gastrointestinal: Denies nausea, vomiting, abdominal pain, diarrhea, constipation, blood in stool and abdominal distention.  Genitourinary: Denies dysuria, urgency, frequency, hematuria, flank pain and difficulty urinating.  Endocrine: Denies: hot or cold intolerance, sweats, changes in hair or nails, polyuria, polydipsia. Musculoskeletal: Denies myalgias, back pain, joint swelling, arthralgias and gait problem.  Skin: Denies pallor, rash and wound.  Neurological: Denies dizziness, seizures, syncope, weakness, light-headedness, numbness and headaches.  Hematological: Denies adenopathy. Easy bruising, personal or family bleeding history  Psychiatric/Behavioral: Denies suicidal ideation, mood changes, confusion, nervousness, sleep disturbance and agitation    Physical  Exam: Vitals:   01/26/20 1025  BP: 98/64  Pulse: 93  Temp: (!) 97.3 F (36.3 C)  TempSrc: Temporal  SpO2: 96%  Weight: 137 lb 4.8 oz (62.3 kg)  Height: 5\' 2"  (1.575 m)    Body mass index is 25.11 kg/m.   Constitutional: NAD, calm, comfortable Eyes: PERRL, lids and  conjunctivae normal, wears corrective lenses ENMT: Mucous membranes are moist.  Respiratory: clear to auscultation bilaterally, no wheezing, no crackles. Normal respiratory effort. No accessory muscle use.  Cardiovascular: Regular rate and rhythm, no murmurs / rubs / gallops. No extremity edema. Neurologic: Grossly intact and nonfocal Psychiatric: Normal judgment and insight. Alert and oriented x 3. Normal mood.    Impression and Plan:  Genital herpes simplex, unspecified site -Occasional Valtrex use when she has an outbreak.  Iron deficiency anemia, unspecified iron deficiency anemia type -On daily iron supplementation. -Check CBC and iron studies when she returns for physical.  Screening for colon cancer  - Plan: Ambulatory referral to Gastroenterology   Patient Instructions  -Nice seeing you today!!  -Schedule follow up for your physical as soon pas possible. Please come in fasting that day.  -Referral to GI for screening colonoscopy today.     Lelon Frohlich, MD [facility]

## 2020-02-07 ENCOUNTER — Ambulatory Visit: Payer: BC Managed Care – PPO | Attending: Internal Medicine

## 2020-02-07 DIAGNOSIS — Z23 Encounter for immunization: Secondary | ICD-10-CM

## 2020-02-07 NOTE — Progress Notes (Signed)
   Covid-19 Vaccination Clinic  Name:  Marguerita Steed    MRN: [id number] DOB: 05-03-1971  02/07/2020  Ms. Lotz was observed post Covid-19 immunization for 15 minutes without incident. She was provided with Vaccine Information Sheet and instruction to access the V-Safe system.   Ms. Nachbar was instructed to call 911 with any severe reactions post vaccine: Marland Kitchen Difficulty breathing  . Swelling of face and throat  . A fast heartbeat  . A bad rash all over body  . Dizziness and weakness   Immunizations Administered    Name Date Dose VIS Date Route   Pfizer COVID-19 Vaccine 02/07/2020  1:14 PM 0.3 mL 10/08/2019 Intramuscular   Manufacturer: [place]   Lot: [id number]   [place]: [id number]

## 2020-02-28 ENCOUNTER — Ambulatory Visit: Payer: BC Managed Care – PPO | Attending: Internal Medicine

## 2020-02-28 DIAGNOSIS — Z23 Encounter for immunization: Secondary | ICD-10-CM

## 2020-02-28 NOTE — Progress Notes (Signed)
   Covid-19 Vaccination Clinic  Name:  Misty Flynn    MRN: [id number] DOB: 04/02/1971  02/28/2020  Misty Flynn was observed post Covid-19 immunization for 15 minutes without incident. She was provided with Vaccine Information Sheet and instruction to access the V-Safe system.   Misty Flynn was instructed to call 911 with any severe reactions post vaccine: Marland Kitchen Difficulty breathing  . Swelling of face and throat  . A fast heartbeat  . A bad rash all over body  . Dizziness and weakness   Immunizations Administered    Name Date Dose VIS Date Route   Pfizer COVID-19 Vaccine 02/28/2020  2:58 PM 0.3 mL 12/22/2018 Intramuscular   Manufacturer: [place]   Lot: [id number]   [place]: [id number]

## 2020-04-21 ENCOUNTER — Encounter: Payer: Self-pay | Admitting: Internal Medicine

## 2020-05-05 ENCOUNTER — Encounter: Payer: BC Managed Care – PPO | Admitting: Internal Medicine

## 2020-05-24 ENCOUNTER — Ambulatory Visit: Payer: BC Managed Care – PPO | Admitting: Obstetrics

## 2020-06-12 DIAGNOSIS — Z20822 Contact with and (suspected) exposure to covid-19: Secondary | ICD-10-CM | POA: Diagnosis not present

## 2020-06-12 DIAGNOSIS — Z03818 Encounter for observation for suspected exposure to other biological agents ruled out: Secondary | ICD-10-CM | POA: Diagnosis not present

## 2020-06-22 ENCOUNTER — Telehealth (INDEPENDENT_AMBULATORY_CARE_PROVIDER_SITE_OTHER): Payer: BC Managed Care – PPO | Admitting: Family Medicine

## 2020-06-22 ENCOUNTER — Encounter: Payer: Self-pay | Admitting: Family Medicine

## 2020-06-22 DIAGNOSIS — J302 Other seasonal allergic rhinitis: Secondary | ICD-10-CM

## 2020-06-22 DIAGNOSIS — R05 Cough: Secondary | ICD-10-CM

## 2020-06-22 DIAGNOSIS — R059 Cough, unspecified: Secondary | ICD-10-CM

## 2020-06-22 MED ORDER — DOXYCYCLINE HYCLATE 100 MG PO TABS: 100.0000 mg | ORAL_TABLET | Freq: Two times a day (BID) | ORAL | 0 refills | Status: DC

## 2020-06-22 NOTE — Progress Notes (Signed)
Virtual Visit via Video Note  I connected with Misty Flynn  on 06/22/20 at 12:20 PM EDT by a video enabled telemedicine application and verified that I am speaking with the correct person using two identifiers.  Location patient: home, Misty Flynn Location provider:work or home office Persons participating in the virtual visit: patient, provider  I discussed the limitations of evaluation and management by telemedicine and the availability of in person appointments. The patient expressed understanding and agreed to proceed.   HPI:  Cough: -started 1 month ago, had some sinus issues at the time initially, PND, some wheezing initially -she had a negative COVID test when it started -did a virtual visit a few weeks ago and was given a cough medication and a steroid (prednisone x5 days, finished today) -prednisone helped a little but still with cough, productive of white phlegm -denies: fevers, chills, SOB, hemoptysis, weight loss, hx of lung issues -she does tend to get seasonal allergies quite a bit, not on allergy medications -denies hx of acid reflux or heartburn  ROS: See pertinent positives and negatives per HPI.  Past Medical History:  Diagnosis Date  . History of chicken pox   . HSV (herpes simplex virus) infection     Past Surgical History:  Procedure Laterality Date  . CESAREAN SECTION    . HERNIA REPAIR      Family History  Problem Relation Age of Onset  . Diabetes Maternal Grandmother   . Cancer Maternal Grandfather        prostate  . Cancer - Prostate Father     SOCIAL HX: see hpi   Current Outpatient Medications:  .  BLISOVI 24 FE 1-20 MG-MCG(24) tablet, TAKE 1 TABLET BY MOUTH EVERY DAY, Disp: 84 tablet, Rfl: 3 .  BLISOVI 24 FE 1-20 MG-MCG(24) tablet, Take 1 tablet by mouth daily., Disp: 84 tablet, Rfl: 4 .  Cyanocobalamin (B-12 PO), Take 1 tablet by mouth daily., Disp: , Rfl:  .  ibuprofen (ADVIL) 800 MG tablet, Take 1 tablet (800 mg total) by mouth every 8 (eight)  hours as needed for cramping., Disp: 30 tablet, Rfl: 5 .  Prenatal Vit-FePoly-FA-DHA (VITAFOL-ONE) 29-1-200 MG CAPS, Take 1 capsule by mouth daily before breakfast., Disp: 90 capsule, Rfl: 4 .  valACYclovir (VALTREX) 500 MG tablet, Take 1 tablet (500 mg total) by mouth 2 (two) times daily. With symptoms of outbreak., Disp: 30 tablet, Rfl: 5 .  doxycycline (VIBRA-TABS) 100 MG tablet, Take 1 tablet (100 mg total) by mouth 2 (two) times daily., Disp: 14 tablet, Rfl: 0  EXAM:  VITALS per patient if applicable:  GENERAL: alert, oriented, appears well and in no acute distress  HEENT: atraumatic, conjunttiva clear, no obvious abnormalities on inspection of external nose and ears  NECK: normal movements of the head and neck  LUNGS: on inspection no signs of respiratory distress, breathing rate appears normal, no obvious gross SOB, gasping or wheezing  CV: no obvious cyanosis  MS: moves all visible extremities without noticeable abnormality  PSYCH/NEURO: pleasant and cooperative, no obvious depression or anxiety, speech and thought processing grossly intact  ASSESSMENT AND PLAN:  Discussed the following assessment and plan:  Cough  Seasonal allergies  -we discussed possible serious and likely etiologies, options for evaluation and workup, limitations of telemedicine visit vs in person visit, treatment, treatment risks and precautions. Pt prefers to treat via telemedicine empirically rather then risking or undertaking an in person visit at this moment. Ongoing cough, already evaluated previously and tried prednisone. Query sinusitis, mild  CAP, post viral cough, PND from allergies or silent GERD as all possible likely causes. Also discussed more ominous causes of a cough of this durations. Opted to start with a course of doxy in case of infection given thick dicolored mucus and duration. Also start allergy regimen with allegra and flonase 2 sprays each nostril daily for 2 weeks given allergies.  Follow up advised in 1-2 weeks, then would try trial of acid reducer if not gone and pulm evaluation, xray, etc. She declined trial alb as is no longer having wheezing.  Advised to seek prompt follow up telemedicine visit or in person care sooner if worsening, new symptoms arise, or if is not improving with treatment.    I discussed the assessment and treatment plan with the patient. The patient was provided an opportunity to ask questions and all were answered. The patient agreed with the plan and demonstrated an understanding of the instructions.   The patient was advised to call back or seek an in-person evaluation if the symptoms worsen or if the condition fails to improve as anticipated.   Lucretia Kern, DO

## 2020-06-22 NOTE — Patient Instructions (Signed)
-  I sent the medication(s) we discussed to your pharmacy: Meds ordered this encounter  Medications   doxycycline (VIBRA-TABS) 100 MG tablet    Sig: Take 1 tablet (100 mg total) by mouth 2 (two) times daily.    Dispense:  14 tablet    Refill:  0    -also start Allegra once daily and flonase 2 sprays each nostril daily for 2 weeks  -follow up with your doctor in 2 weeks, sooner if your symptoms worsen, new concerns arise or you are not improving with treatment.

## 2020-07-26 ENCOUNTER — Ambulatory Visit: Payer: BC Managed Care – PPO | Admitting: Obstetrics

## 2020-07-27 ENCOUNTER — Ambulatory Visit (INDEPENDENT_AMBULATORY_CARE_PROVIDER_SITE_OTHER): Payer: BC Managed Care – PPO | Admitting: Internal Medicine

## 2020-07-27 ENCOUNTER — Other Ambulatory Visit: Payer: Self-pay

## 2020-07-27 VITALS — BP 110/70 | HR 96 | Temp 98.4°F | Ht 63.5 in | Wt 137.2 lb

## 2020-07-27 DIAGNOSIS — Z0000 Encounter for general adult medical examination without abnormal findings: Secondary | ICD-10-CM | POA: Diagnosis not present

## 2020-07-27 DIAGNOSIS — N921 Excessive and frequent menstruation with irregular cycle: Secondary | ICD-10-CM | POA: Diagnosis not present

## 2020-07-27 DIAGNOSIS — A6000 Herpesviral infection of urogenital system, unspecified: Secondary | ICD-10-CM

## 2020-07-27 DIAGNOSIS — D509 Iron deficiency anemia, unspecified: Secondary | ICD-10-CM

## 2020-07-27 NOTE — Patient Instructions (Signed)
-Nice seeing you today!!  -Lab work today; will notify you once results are available.  -Mammogram and GI referral will be requested today.  -Consider flu vaccine at your pharmacy.  -Schedule follow up in 1 year or sooner as needed.   Preventive Care 67-47 Years Old, Female Preventive care refers to visits with your health care provider and lifestyle choices that can promote health and wellness. This includes:  A yearly physical exam. This may also be called an annual well check.  Regular dental visits and eye exams.  Immunizations.  Screening for certain conditions.  Healthy lifestyle choices, such as eating a healthy diet, getting regular exercise, not using drugs or products that contain nicotine and tobacco, and limiting alcohol use. What can I expect for my preventive care visit? Physical exam Your health care provider will check your:  Height and weight. This may be used to calculate body mass index (BMI), which tells if you are at a healthy weight.  Heart rate and blood pressure.  Skin for abnormal spots. Counseling Your health care provider may ask you questions about your:  Alcohol, tobacco, and drug use.  Emotional well-being.  Home and relationship well-being.  Sexual activity.  Eating habits.  Work and work Statistician.  Method of birth control.  Menstrual cycle.  Pregnancy history. What immunizations do I need?  Influenza (flu) vaccine  This is recommended every year. Tetanus, diphtheria, and pertussis (Tdap) vaccine  You may need a Td booster every 10 years. Varicella (chickenpox) vaccine  You may need this if you have not been vaccinated. Zoster (shingles) vaccine  You may need this after age 41. Measles, mumps, and rubella (MMR) vaccine  You may need at least one dose of MMR if you were born in 1957 or later. You may also need a second dose. Pneumococcal conjugate (PCV13) vaccine  You may need this if you have certain conditions  and were not previously vaccinated. Pneumococcal polysaccharide (PPSV23) vaccine  You may need one or two doses if you smoke cigarettes or if you have certain conditions. Meningococcal conjugate (MenACWY) vaccine  You may need this if you have certain conditions. Hepatitis A vaccine  You may need this if you have certain conditions or if you travel or work in places where you may be exposed to hepatitis A. Hepatitis B vaccine  You may need this if you have certain conditions or if you travel or work in places where you may be exposed to hepatitis B. Haemophilus influenzae type b (Hib) vaccine  You may need this if you have certain conditions. Human papillomavirus (HPV) vaccine  If recommended by your health care provider, you may need three doses over 6 months. You may receive vaccines as individual doses or as more than one vaccine together in one shot (combination vaccines). Talk with your health care provider about the risks and benefits of combination vaccines. What tests do I need? Blood tests  Lipid and cholesterol levels. These may be checked every 5 years, or more frequently if you are over 47 years old.  Hepatitis C test.  Hepatitis B test. Screening  Lung cancer screening. You may have this screening every year starting at age 47 if you have a 30-pack-year history of smoking and currently smoke or have quit within the past 15 years.  Colorectal cancer screening. All adults should have this screening starting at age 47 and continuing until age 17. Your health care provider may recommend screening at age 47 if you are at  increased risk. You will have tests every 1-10 years, depending on your results and the type of screening test.  Diabetes screening. This is done by checking your blood sugar (glucose) after you have not eaten for a while (fasting). You may have this done every 1-3 years.  Mammogram. This may be done every 1-2 years. Talk with your health care provider  about when you should start having regular mammograms. This may depend on whether you have a family history of breast cancer.  BRCA-related cancer screening. This may be done if you have a family history of breast, ovarian, tubal, or peritoneal cancers.  Pelvic exam and Pap test. This may be done every 3 years starting at age 47. Starting at age 47, this may be done every 5 years if you have a Pap test in combination with an HPV test. Other tests  Sexually transmitted disease (STD) testing.  Bone density scan. This is done to screen for osteoporosis. You may have this scan if you are at high risk for osteoporosis. Follow these instructions at home: Eating and drinking  Eat a diet that includes fresh fruits and vegetables, whole grains, lean protein, and low-fat dairy.  Take vitamin and mineral supplements as recommended by your health care provider.  Do not drink alcohol if: ? Your health care provider tells you not to drink. ? You are pregnant, may be pregnant, or are planning to become pregnant.  If you drink alcohol: ? Limit how much you have to 0-1 drink a day. ? Be aware of how much alcohol is in your drink. In the [place] one drink equals one 12 oz bottle of beer (355 mL), one 5 oz glass of wine (148 mL), or one 1 oz glass of hard liquor (44 mL). Lifestyle  Take daily care of your teeth and gums.  Stay active. Exercise for at least 30 minutes on 5 or more days each week.  Do not use any products that contain nicotine or tobacco, such as cigarettes, e-cigarettes, and chewing tobacco. If you need help quitting, ask your health care provider.  If you are sexually active, practice safe sex. Use a condom or other form of birth control (contraception) in order to prevent pregnancy and STIs (sexually transmitted infections).  If told by your health care provider, take low-dose aspirin daily starting at age 64. What's next?  Visit your health care provider once a year for a well  check visit.  Ask your health care provider how often you should have your eyes and teeth checked.  Stay up to date on all vaccines. This information is not intended to replace advice given to you by your health care provider. Make sure you discuss any questions you have with your health care provider. Document Revised: 06/25/2018 Document Reviewed: 06/25/2018 Elsevier Patient Education  2020 [employer].

## 2020-07-27 NOTE — Progress Notes (Signed)
Established Patient Office Visit     This visit occurred during the SARS-CoV-2 public health emergency.  Safety protocols were in place, including screening questions prior to the visit, additional usage of staff PPE, and extensive cleaning of exam room while observing appropriate contact time as indicated for disinfecting solutions.    CC/Reason for Visit: Annual preventive exam  HPI: Misty Flynn is a 49 y.o. female who is coming in today for the above mentioned reasons. Past Medical History is significant for: Genital herpes and history of iron deficiency anemia.  She is having irregular menstrual periods and menometrorrhagia.  She has an appointment with GYN tomorrow.  Since I last saw her she has had both of her Covid vaccines.  She is declining flu vaccine today.  She is overdue for mammogram and colonoscopy.   Past Medical/Surgical History: Past Medical History:  Diagnosis Date  . History of chicken pox   . HSV (herpes simplex virus) infection     Past Surgical History:  Procedure Laterality Date  . CESAREAN SECTION    . HERNIA REPAIR      Social History:  reports that she has never smoked. She has never used smokeless tobacco. She reports that she does not drink alcohol and does not use drugs.  Allergies: No Known Allergies  Family History:  Family History  Problem Relation Age of Onset  . Diabetes Maternal Grandmother   . Cancer Maternal Grandfather        prostate  . Cancer - Prostate Father      Current Outpatient Medications:  .  BLISOVI 24 FE 1-20 MG-MCG(24) tablet, TAKE 1 TABLET BY MOUTH EVERY DAY, Disp: 84 tablet, Rfl: 3 .  BLISOVI 24 FE 1-20 MG-MCG(24) tablet, Take 1 tablet by mouth daily., Disp: 84 tablet, Rfl: 4 .  doxycycline (VIBRA-TABS) 100 MG tablet, Take 1 tablet (100 mg total) by mouth 2 (two) times daily., Disp: 14 tablet, Rfl: 0 .  ibuprofen (ADVIL) 800 MG tablet, Take 1 tablet (800 mg total) by mouth every 8 (eight) hours as needed for  cramping., Disp: 30 tablet, Rfl: 5 .  Prenatal Vit-FePoly-FA-DHA (VITAFOL-ONE) 29-1-200 MG CAPS, Take 1 capsule by mouth daily before breakfast., Disp: 90 capsule, Rfl: 4 .  valACYclovir (VALTREX) 500 MG tablet, Take 1 tablet (500 mg total) by mouth 2 (two) times daily. With symptoms of outbreak., Disp: 30 tablet, Rfl: 5 .  Cyanocobalamin (B-12 PO), Take 1 tablet by mouth daily. (Patient not taking: Reported on 07/27/2020), Disp: , Rfl:   Review of Systems:  Constitutional: Denies fever, chills, diaphoresis, appetite change and fatigue.  HEENT: Denies photophobia, eye pain, redness, hearing loss, ear pain, congestion, sore throat, rhinorrhea, sneezing, mouth sores, trouble swallowing, neck pain, neck stiffness and tinnitus.   Respiratory: Denies SOB, DOE, cough, chest tightness,  and wheezing.   Cardiovascular: Denies chest pain, palpitations and leg swelling.  Gastrointestinal: Denies nausea, vomiting, abdominal pain, diarrhea, constipation, blood in stool and abdominal distention.  Genitourinary: Denies dysuria, urgency, frequency, hematuria, flank pain and difficulty urinating.  Endocrine: Denies: hot or cold intolerance, sweats, changes in hair or nails, polyuria, polydipsia. Musculoskeletal: Denies myalgias, back pain, joint swelling, arthralgias and gait problem.  Skin: Denies pallor, rash and wound.  Neurological: Denies dizziness, seizures, syncope, weakness, light-headedness, numbness and headaches.  Hematological: Denies adenopathy. Easy bruising, personal or family bleeding history  Psychiatric/Behavioral: Denies suicidal ideation, mood changes, confusion, nervousness, sleep disturbance and agitation    Physical Exam: Vitals:   07/27/20  0845  BP: 110/70  Pulse: 96  Temp: 98.4 F (36.9 C)  TempSrc: Oral  SpO2: 97%  Weight: 137 lb 3.2 oz (62.2 kg)  Height: 5' 3.5" (1.613 m)    Body mass index is 23.92 kg/m.   Constitutional: NAD, calm, comfortable Eyes: PERRL, lids and  conjunctivae normal ENMT: Mucous membranes are moist. Posterior pharynx clear of any exudate or lesions. Normal dentition. Tympanic membrane is pearly white, no erythema or bulging. Neck: normal, supple, no masses, no thyromegaly Respiratory: clear to auscultation bilaterally, no wheezing, no crackles. Normal respiratory effort. No accessory muscle use.  Cardiovascular: Regular rate and rhythm, no murmurs / rubs / gallops. No extremity edema. 2+ pedal pulses. No carotid bruits.  Abdomen: no tenderness, no masses palpated. No hepatosplenomegaly. Bowel sounds positive.  Musculoskeletal: no clubbing / cyanosis. No joint deformity upper and lower extremities. Good ROM, no contractures. Normal muscle tone.  Skin: no rashes, lesions, ulcers. No induration Neurologic: CN 2-12 grossly intact. Sensation intact, DTR normal. Strength 5/5 in all 4.  Psychiatric: Normal judgment and insight. Alert and oriented x 3. Normal mood.    Impression and Plan:  Encounter for preventive health examination  -She has routine eye and dental care. -Due for flu vaccine, declines today, otherwise immunizations are up-to-date. -Screening labs today. -Healthy lifestyle discussed in detail. -Overdue for screening colonoscopy and mammogram, referrals placed today. -She has cervical cancer screening yearly with her GYN.  Genital herpes simplex, unspecified site -Uses Valtrex as needed for outbreaks.  Menorrhagia with irregular cycle  - Plan: CBC with Differential/Platelet, Iron, TIBC and Ferritin Panel -Follow-up with GYN tomorrow.  Iron deficiency anemia, unspecified iron deficiency anemia type  - Plan: CBC with Differential/Platelet, Iron, TIBC and Ferritin Panel -Will see GYN tomorrow.   Patient Instructions  -Nice seeing you today!!  -Lab work today; will notify you once results are available.  -Mammogram and GI referral will be requested today.  -Consider flu vaccine at your pharmacy.  -Schedule follow  up in 1 year or sooner as needed.   Preventive Care 70-44 Years Old, Female Preventive care refers to visits with your health care provider and lifestyle choices that can promote health and wellness. This includes:  A yearly physical exam. This may also be called an annual well check.  Regular dental visits and eye exams.  Immunizations.  Screening for certain conditions.  Healthy lifestyle choices, such as eating a healthy diet, getting regular exercise, not using drugs or products that contain nicotine and tobacco, and limiting alcohol use. What can I expect for my preventive care visit? Physical exam Your health care provider will check your:  Height and weight. This may be used to calculate body mass index (BMI), which tells if you are at a healthy weight.  Heart rate and blood pressure.  Skin for abnormal spots. Counseling Your health care provider may ask you questions about your:  Alcohol, tobacco, and drug use.  Emotional well-being.  Home and relationship well-being.  Sexual activity.  Eating habits.  Work and work Statistician.  Method of birth control.  Menstrual cycle.  Pregnancy history. What immunizations do I need?  Influenza (flu) vaccine  This is recommended every year. Tetanus, diphtheria, and pertussis (Tdap) vaccine  You may need a Td booster every 10 years. Varicella (chickenpox) vaccine  You may need this if you have not been vaccinated. Zoster (shingles) vaccine  You may need this after age 90. Measles, mumps, and rubella (MMR) vaccine  You may need at  least one dose of MMR if you were born in 1957 or later. You may also need a second dose. Pneumococcal conjugate (PCV13) vaccine  You may need this if you have certain conditions and were not previously vaccinated. Pneumococcal polysaccharide (PPSV23) vaccine  You may need one or two doses if you smoke cigarettes or if you have certain conditions. Meningococcal conjugate (MenACWY)  vaccine  You may need this if you have certain conditions. Hepatitis A vaccine  You may need this if you have certain conditions or if you travel or work in places where you may be exposed to hepatitis A. Hepatitis B vaccine  You may need this if you have certain conditions or if you travel or work in places where you may be exposed to hepatitis B. Haemophilus influenzae type b (Hib) vaccine  You may need this if you have certain conditions. Human papillomavirus (HPV) vaccine  If recommended by your health care provider, you may need three doses over 6 months. You may receive vaccines as individual doses or as more than one vaccine together in one shot (combination vaccines). Talk with your health care provider about the risks and benefits of combination vaccines. What tests do I need? Blood tests  Lipid and cholesterol levels. These may be checked every 5 years, or more frequently if you are over 68 years old.  Hepatitis C test.  Hepatitis B test. Screening  Lung cancer screening. You may have this screening every year starting at age 48 if you have a 30-pack-year history of smoking and currently smoke or have quit within the past 15 years.  Colorectal cancer screening. All adults should have this screening starting at age 17 and continuing until age 61. Your health care provider may recommend screening at age 87 if you are at increased risk. You will have tests every 1-10 years, depending on your results and the type of screening test.  Diabetes screening. This is done by checking your blood sugar (glucose) after you have not eaten for a while (fasting). You may have this done every 1-3 years.  Mammogram. This may be done every 1-2 years. Talk with your health care provider about when you should start having regular mammograms. This may depend on whether you have a family history of breast cancer.  BRCA-related cancer screening. This may be done if you have a family history of  breast, ovarian, tubal, or peritoneal cancers.  Pelvic exam and Pap test. This may be done every 3 years starting at age 20. Starting at age 45, this may be done every 5 years if you have a Pap test in combination with an HPV test. Other tests  Sexually transmitted disease (STD) testing.  Bone density scan. This is done to screen for osteoporosis. You may have this scan if you are at high risk for osteoporosis. Follow these instructions at home: Eating and drinking  Eat a diet that includes fresh fruits and vegetables, whole grains, lean protein, and low-fat dairy.  Take vitamin and mineral supplements as recommended by your health care provider.  Do not drink alcohol if: ? Your health care provider tells you not to drink. ? You are pregnant, may be pregnant, or are planning to become pregnant.  If you drink alcohol: ? Limit how much you have to 0-1 drink a day. ? Be aware of how much alcohol is in your drink. In the [place] one drink equals one 12 oz bottle of beer (355 mL), one 5 oz glass of wine (  148 mL), or one 1 oz glass of hard liquor (44 mL). Lifestyle  Take daily care of your teeth and gums.  Stay active. Exercise for at least 30 minutes on 5 or more days each week.  Do not use any products that contain nicotine or tobacco, such as cigarettes, e-cigarettes, and chewing tobacco. If you need help quitting, ask your health care provider.  If you are sexually active, practice safe sex. Use a condom or other form of birth control (contraception) in order to prevent pregnancy and STIs (sexually transmitted infections).  If told by your health care provider, take low-dose aspirin daily starting at age 65. What's next?  Visit your health care provider once a year for a well check visit.  Ask your health care provider how often you should have your eyes and teeth checked.  Stay up to date on all vaccines. This information is not intended to replace advice given to you by your  health care provider. Make sure you discuss any questions you have with your health care provider. Document Revised: 06/25/2018 Document Reviewed: 06/25/2018 Elsevier Patient Education  2020 [place], [place] [facility]

## 2020-07-27 NOTE — Progress Notes (Signed)
   Patient present for office visit with Dr Jerilee Hoh.

## 2020-07-28 ENCOUNTER — Other Ambulatory Visit (HOSPITAL_COMMUNITY)
Admission: RE | Admit: 2020-07-28 | Discharge: 2020-07-28 | Disposition: A | Discharge status: Home / Self Care | Payer: BC Managed Care – PPO | Source: Ambulatory Visit | Attending: Obstetrics | Admitting: Obstetrics

## 2020-07-28 ENCOUNTER — Ambulatory Visit (INDEPENDENT_AMBULATORY_CARE_PROVIDER_SITE_OTHER): Payer: BC Managed Care – PPO | Admitting: Obstetrics

## 2020-07-28 ENCOUNTER — Encounter: Payer: Self-pay | Admitting: Obstetrics

## 2020-07-28 VITALS — BP 128/83 | HR 85 | Wt 139.0 lb

## 2020-07-28 DIAGNOSIS — N939 Abnormal uterine and vaginal bleeding, unspecified: Secondary | ICD-10-CM

## 2020-07-28 DIAGNOSIS — N946 Dysmenorrhea, unspecified: Secondary | ICD-10-CM | POA: Diagnosis not present

## 2020-07-28 DIAGNOSIS — N898 Other specified noninflammatory disorders of vagina: Secondary | ICD-10-CM | POA: Diagnosis not present

## 2020-07-28 DIAGNOSIS — E569 Vitamin deficiency, unspecified: Secondary | ICD-10-CM | POA: Diagnosis not present

## 2020-07-28 LAB — CBC WITH DIFFERENTIAL/PLATELET
Absolute Monocytes: 308 cells/uL (ref 200–950)
Basophils Absolute: 31 cells/uL (ref 0–200)
Basophils Relative: 0.8 %
Eosinophils Absolute: 59 cells/uL (ref 15–500)
Eosinophils Relative: 1.5 %
HCT: 33.5 % — ABNORMAL LOW (ref 35.0–45.0)
Hemoglobin: 11 g/dL — ABNORMAL LOW (ref 11.7–15.5)
Lymphs Abs: 2016 cells/uL (ref 850–3900)
MCH: 29.9 pg (ref 27.0–33.0)
MCHC: 32.8 g/dL (ref 32.0–36.0)
MCV: 91 fL (ref 80.0–100.0)
MPV: 9 fL (ref 7.5–12.5)
Monocytes Relative: 7.9 %
Neutro Abs: 1486 cells/uL — ABNORMAL LOW (ref 1500–7800)
Neutrophils Relative %: 38.1 %
Platelets: 317 10*3/uL (ref 140–400)
RBC: 3.68 10*6/uL — ABNORMAL LOW (ref 3.80–5.10)
RDW: 12 % (ref 11.0–15.0)
Total Lymphocyte: 51.7 %
WBC: 3.9 10*3/uL (ref 3.8–10.8)

## 2020-07-28 LAB — COMPREHENSIVE METABOLIC PANEL
AG Ratio: 1.9 (calc) (ref 1.0–2.5)
ALT: 7 U/L (ref 6–29)
AST: 15 U/L (ref 10–35)
Albumin: 3.9 g/dL (ref 3.6–5.1)
Alkaline phosphatase (APISO): 35 U/L (ref 31–125)
BUN: 10 mg/dL (ref 7–25)
CO2: 25 mmol/L (ref 20–32)
Calcium: 8.9 mg/dL (ref 8.6–10.2)
Chloride: 106 mmol/L (ref 98–110)
Creat: 0.83 mg/dL (ref 0.50–1.10)
Globulin: 2.1 g/dL (calc) (ref 1.9–3.7)
Glucose, Bld: 102 mg/dL — ABNORMAL HIGH (ref 65–99)
Potassium: 4 mmol/L (ref 3.5–5.3)
Sodium: 139 mmol/L (ref 135–146)
Total Bilirubin: 0.4 mg/dL (ref 0.2–1.2)
Total Protein: 6 g/dL — ABNORMAL LOW (ref 6.1–8.1)

## 2020-07-28 LAB — LIPID PANEL
Cholesterol: 166 mg/dL (ref <200)
HDL: 54 mg/dL (ref >=50)
LDL Cholesterol (Calc): 96 mg/dL (calc)
Non-HDL Cholesterol (Calc): 112 mg/dL (calc) (ref <130)
Total CHOL/HDL Ratio: 3.1 (calc) (ref <5.0)
Triglycerides: 69 mg/dL (ref <150)

## 2020-07-28 LAB — SPECIMEN COMPROMISED

## 2020-07-28 LAB — IRON,TIBC AND FERRITIN PANEL
%SAT: 14 % (calc) — ABNORMAL LOW (ref 16–45)
Ferritin: 18 ng/mL (ref 16–232)
Iron: 47 ug/dL (ref 40–190)
TIBC: 325 mcg/dL (calc) (ref 250–450)

## 2020-07-28 MED ORDER — IBUPROFEN 800 MG PO TABS: 800.0000 mg | ORAL_TABLET | Freq: Three times a day (TID) | ORAL | 5 refills | Status: DC | PRN

## 2020-07-28 MED ORDER — PRENATAL 27-1 MG PO TABS: 1.0000 | ORAL_TABLET | Freq: Every day | ORAL | 4 refills | Status: DC

## 2020-07-28 NOTE — Progress Notes (Signed)
Pt has had irregular heavy bleeding with clots and cramping for several weeks.  Pt is taking Blisovi Fe for BC. Pt states last normal cycle was couple months ago.

## 2020-07-28 NOTE — Progress Notes (Signed)
Patient ID: Misty Flynn, female   DOB: 03/14/1971, 49 y.o.   MRN: [id number]  No chief complaint on file.   HPI Misty Flynn is a 49 y.o. female.  Complains of irregular, heavy and painful periods. HPI  Past Medical History:  Diagnosis Date  . History of chicken pox   . HSV (herpes simplex virus) infection     Past Surgical History:  Procedure Laterality Date  . CESAREAN SECTION    . HERNIA REPAIR      Family History  Problem Relation Age of Onset  . Diabetes Maternal Grandmother   . Cancer Maternal Grandfather        prostate  . Cancer - Prostate Father     Social History Social History   Tobacco Use  . Smoking status: Never Smoker  . Smokeless tobacco: Never Used  Substance Use Topics  . Alcohol use: No    Alcohol/week: 0.0 standard drinks  . Drug use: No    No Known Allergies  Current Outpatient Medications  Medication Sig Dispense Refill  . BLISOVI 24 FE 1-20 MG-MCG(24) tablet Take 1 tablet by mouth daily. 84 tablet 4  . BLISOVI 24 FE 1-20 MG-MCG(24) tablet TAKE 1 TABLET BY MOUTH EVERY DAY 84 tablet 3  . Cyanocobalamin (B-12 PO) Take 1 tablet by mouth daily. (Patient not taking: Reported on 07/27/2020)    . doxycycline (VIBRA-TABS) 100 MG tablet Take 1 tablet (100 mg total) by mouth 2 (two) times daily. 14 tablet 0  . ibuprofen (ADVIL) 800 MG tablet Take 1 tablet (800 mg total) by mouth every 8 (eight) hours as needed for cramping. 30 tablet 5  . Prenatal 27-1 MG TABS Take 1 tablet by mouth daily before breakfast. 90 tablet 4  . valACYclovir (VALTREX) 500 MG tablet Take 1 tablet (500 mg total) by mouth 2 (two) times daily. With symptoms of outbreak. 30 tablet 5   No current facility-administered medications for this visit.    Review of Systems Review of Systems Constitutional: negative for fatigue and weight loss Respiratory: negative for cough and wheezing Cardiovascular: negative for chest pain, fatigue and palpitations Gastrointestinal: negative  for abdominal pain and change in bowel habits Genitourinary: positive for irregular, heavy periods Integument/breast: negative for nipple discharge Musculoskeletal:negative for myalgias Neurological: negative for gait problems and tremors Behavioral/Psych: negative for abusive relationship, depression Endocrine: negative for temperature intolerance      Blood pressure 128/83, pulse 85, weight 139 lb (63 kg), last menstrual period 05/28/2020.  Physical Exam Physical Exam           General: Alert and no distress Abdomen:  normal findings: no organomegaly, soft, non-tender and no hernia  Pelvis:  External genitalia: normal general appearance Urinary system: urethral meatus normal and bladder without fullness, nontender Vaginal: normal without tenderness, induration or masses Cervix: normal appearance Adnexa: normal bimanual exam Uterus: anteverted and non-tender, normal size    50% of 15 min visit spent on counseling and coordination of care.   Data Reviewed Labs Wet Prep  Assessment     1. Abnormal uterine bleeding (AUB) Rx: - US PELVIC COMPLETE WITH TRANSVAGINAL; Future  2. Vaginal discharge Rx: - Cervicovaginal ancillary only( )  3. Dysmenorrhea Rx: - ibuprofen (ADVIL) 800 MG tablet; Take 1 tablet (800 mg total) by mouth every 8 (eight) hours as needed for cramping.  Dispense: 30 tablet; Refill: 5  4. Vitamin deficiency Rx: - Prenatal 27-1 MG TABS; Take 1 tablet by mouth daily before breakfast.  Dispense: 90  tablet; Refill: 4    Plan   Follow up in 2 weeks  Orders Placed This Encounter  Procedures  . US PELVIC COMPLETE WITH TRANSVAGINAL    Standing Status:   Future    Standing Expiration Date:   07/28/2021    Order Specific Question:   Reason for Exam (SYMPTOM  OR DIAGNOSIS REQUIRED)    Answer:   AUB    Order Specific Question:   Preferred imaging location?    Answer:   [id number]   Meds ordered this encounter  Medications  . Prenatal 27-1  MG TABS    Sig: Take 1 tablet by mouth daily before breakfast.    Dispense:  90 tablet    Refill:  4  . ibuprofen (ADVIL) 800 MG tablet    Sig: Take 1 tablet (800 mg total) by mouth every 8 (eight) hours as needed for cramping.    Dispense:  30 tablet    Refill:  5     Shelly Bombard, MD 07/28/2020 10:45 AM

## 2020-07-29 DIAGNOSIS — Z20822 Contact with and (suspected) exposure to covid-19: Secondary | ICD-10-CM | POA: Diagnosis not present

## 2020-07-29 DIAGNOSIS — Z03818 Encounter for observation for suspected exposure to other biological agents ruled out: Secondary | ICD-10-CM | POA: Diagnosis not present

## 2020-07-31 LAB — CERVICOVAGINAL ANCILLARY ONLY
Bacterial Vaginitis (gardnerella): NEGATIVE
Candida Glabrata: NEGATIVE
Candida Vaginitis: NEGATIVE
Chlamydia: NEGATIVE
Comment: NEGATIVE
Comment: NEGATIVE
Comment: NEGATIVE
Comment: NEGATIVE
Comment: NEGATIVE
Comment: NORMAL
Neisseria Gonorrhea: NEGATIVE
Trichomonas: NEGATIVE

## 2020-08-16 ENCOUNTER — Other Ambulatory Visit: Payer: BC Managed Care – PPO

## 2020-08-30 ENCOUNTER — Encounter: Payer: Self-pay | Admitting: Obstetrics

## 2020-08-30 ENCOUNTER — Telehealth (INDEPENDENT_AMBULATORY_CARE_PROVIDER_SITE_OTHER): Payer: BC Managed Care – PPO | Admitting: Obstetrics

## 2020-08-30 DIAGNOSIS — N939 Abnormal uterine and vaginal bleeding, unspecified: Secondary | ICD-10-CM | POA: Diagnosis not present

## 2020-08-30 DIAGNOSIS — N946 Dysmenorrhea, unspecified: Secondary | ICD-10-CM | POA: Diagnosis not present

## 2020-08-30 NOTE — Progress Notes (Signed)
   TELEHEALTH GYNECOLOGY VISIT ENCOUNTER NOTE  I connected with Misty Flynn on 08/30/20 at  2:00 PM EDT by telephone at home and verified that I am speaking with the correct person using two identifiers.   I discussed the limitations, risks, security and privacy concerns of performing an evaluation and management service by telephone and the availability of in person appointments. I also discussed with the patient that there may be a patient responsible charge related to this service. The patient expressed understanding and agreed to proceed.   History:  Misty Flynn is a 49 y.o. [id number] female being evaluated today for AUB.  She states that her AUB has resolved. She denies any abnormal vaginal discharge, bleeding, pelvic pain or other concerns.       Past Medical History:  Diagnosis Date  . History of chicken pox   . HSV (herpes simplex virus) infection    Past Surgical History:  Procedure Laterality Date  . CESAREAN SECTION    . HERNIA REPAIR     The following portions of the patient's history were reviewed and updated as appropriate: allergies, current medications, past family history, past medical history, past social history, past surgical history and problem list.   Health Maintenance:  Normal pap and negative HRHPV on 08-20-2019.  Normal mammogram on 09-18-2018.   Review of Systems:  Pertinent items noted in HPI and remainder of comprehensive ROS otherwise negative.  Physical Exam:   General:  Alert, oriented and cooperative.   Mental Status: Normal mood and affect perceived. Normal judgment and thought content.  Physical exam deferred due to nature of the encounter  Labs and Imaging No results found for this or any previous visit (from the past 336 hour(s)). No results found.    Assessment and Plan:     1. Abnormal uterine bleeding (AUB) - clinically stable  - ultrasound scheduled  2. Dysmenorrhea - Ibuprofen prn     I discussed the assessment and treatment  plan with the patient. The patient was provided an opportunity to ask questions and all were answered. The patient agreed with the plan and demonstrated an understanding of the instructions.   The patient was advised to call back or seek an in-person evaluation/go to the ED if the symptoms worsen or if the condition fails to improve as anticipated.  I provided 15 minutes of non-face-to-face time during this encounter.   Baltazar Najjar, MD [facility] 08/30/2020 1:54 PM

## 2020-09-11 ENCOUNTER — Other Ambulatory Visit: Payer: BC Managed Care – PPO

## 2020-09-18 ENCOUNTER — Telehealth: Payer: BC Managed Care – PPO | Admitting: Obstetrics

## 2020-09-28 ENCOUNTER — Other Ambulatory Visit: Payer: BC Managed Care – PPO

## 2020-10-05 ENCOUNTER — Ambulatory Visit: Payer: BC Managed Care – PPO | Admitting: Obstetrics

## 2020-10-06 DIAGNOSIS — Z1231 Encounter for screening mammogram for malignant neoplasm of breast: Secondary | ICD-10-CM | POA: Diagnosis not present

## 2020-10-09 ENCOUNTER — Encounter: Payer: Self-pay | Admitting: Obstetrics

## 2020-10-10 ENCOUNTER — Other Ambulatory Visit: Payer: Self-pay | Admitting: Obstetrics

## 2020-11-06 ENCOUNTER — Ambulatory Visit: Payer: BC Managed Care – PPO | Admitting: Obstetrics

## 2021-02-06 ENCOUNTER — Other Ambulatory Visit: Payer: Self-pay

## 2021-02-06 DIAGNOSIS — Z3041 Encounter for surveillance of contraceptive pills: Secondary | ICD-10-CM

## 2021-02-06 MED ORDER — BLISOVI 24 FE 1-20 MG-MCG(24) PO TABS: 1.0000 | ORAL_TABLET | Freq: Every day | ORAL | 0 refills | Status: DC

## 2021-02-06 NOTE — Telephone Encounter (Signed)
Received call from patient who was very upset she has not received her rx for birth control pills. She reports speaking with front staff about her birth control and the front did not relay the message to triage. I attempted to apologize for the lack of communication regarding her message. Patient became frustrated with me  after asking what pharmacy and the name of medications. Pt states I can't believed this. I continue to  apologize and to make her aware I would call in one refill until her schedule appointment. Patient tone of voice got louder and I told her I was ending the call do to her being disrespectful.

## 2021-02-16 ENCOUNTER — Other Ambulatory Visit: Payer: Self-pay | Admitting: *Deleted

## 2021-02-16 DIAGNOSIS — Z3041 Encounter for surveillance of contraceptive pills: Secondary | ICD-10-CM

## 2021-02-16 MED ORDER — BLISOVI 24 FE 1-20 MG-MCG(24) PO TABS: 1.0000 | ORAL_TABLET | Freq: Every day | ORAL | 4 refills | Status: DC

## 2021-02-16 NOTE — Progress Notes (Signed)
Pt called to office stating that [facility] pill was not given as prescribed.  Pt states she usually gets Blisovi brand and she got another pill this time. Rx was reordered today as DAW.  Advised to contact office if any problems.

## 2021-02-21 ENCOUNTER — Other Ambulatory Visit (HOSPITAL_COMMUNITY)
Admission: RE | Admit: 2021-02-21 | Discharge: 2021-02-21 | Disposition: A | Discharge status: Home / Self Care | Payer: BC Managed Care – PPO | Source: Ambulatory Visit | Attending: Obstetrics | Admitting: Obstetrics

## 2021-02-21 ENCOUNTER — Other Ambulatory Visit: Payer: Self-pay

## 2021-02-21 ENCOUNTER — Encounter: Payer: Self-pay | Admitting: Obstetrics

## 2021-02-21 ENCOUNTER — Ambulatory Visit (INDEPENDENT_AMBULATORY_CARE_PROVIDER_SITE_OTHER): Payer: BC Managed Care – PPO | Admitting: Obstetrics

## 2021-02-21 VITALS — BP 108/70 | HR 85 | Wt 139.0 lb

## 2021-02-21 DIAGNOSIS — N898 Other specified noninflammatory disorders of vagina: Secondary | ICD-10-CM | POA: Insufficient documentation

## 2021-02-21 NOTE — Progress Notes (Signed)
Patient ID: Misty Flynn, female   DOB: 1970/12/04, 50 y.o.   MRN: [id number]  Chief Complaint  Patient presents with  . Vaginitis    HPI Misty Flynn is a 50 y.o. female.  Complains of vaginal discharge with itching.  Denies vaginal odor. HPI  Past Medical History:  Diagnosis Date  . History of chicken pox   . HSV (herpes simplex virus) infection     Past Surgical History:  Procedure Laterality Date  . CESAREAN SECTION    . HERNIA REPAIR      Family History  Problem Relation Age of Onset  . Diabetes Maternal Grandmother   . Cancer Maternal Grandfather        prostate  . Cancer - Prostate Father     Social History Social History   Tobacco Use  . Smoking status: Never Smoker  . Smokeless tobacco: Never Used  Substance Use Topics  . Alcohol use: No    Alcohol/week: 0.0 standard drinks  . Drug use: No    No Known Allergies  Current Outpatient Medications  Medication Sig Dispense Refill  . BLISOVI 24 FE 1-20 MG-MCG(24) tablet Take 1 tablet by mouth daily. 84 tablet 4  . Cyanocobalamin (B-12 PO) Take 1 tablet by mouth daily. (Patient not taking: Reported on 07/27/2020)    . doxycycline (VIBRA-TABS) 100 MG tablet Take 1 tablet (100 mg total) by mouth 2 (two) times daily. 14 tablet 0  . ibuprofen (ADVIL) 800 MG tablet Take 1 tablet (800 mg total) by mouth every 8 (eight) hours as needed for cramping. 30 tablet 5  . Prenatal 27-1 MG TABS Take 1 tablet by mouth daily before breakfast. 90 tablet 4  . valACYclovir (VALTREX) 500 MG tablet Take 1 tablet (500 mg total) by mouth 2 (two) times daily. With symptoms of outbreak. 30 tablet 5   No current facility-administered medications for this visit.    Review of Systems Review of Systems Constitutional: negative for fatigue and weight loss Respiratory: negative for cough and wheezing Cardiovascular: negative for chest pain, fatigue and palpitations Gastrointestinal: negative for abdominal pain and change in bowel  habits Genitourinary: positive for vaginal discharge with itching, no odor Integument/breast: negative for nipple discharge Musculoskeletal:negative for myalgias Neurological: negative for gait problems and tremors Behavioral/Psych: negative for abusive relationship, depression Endocrine: negative for temperature intolerance      Blood pressure 108/70, pulse 85, weight 139 lb (63 kg), last menstrual period 02/07/2021.  Physical Exam Physical Exam           General: Alert and no distress Abdomen:  normal findings: no organomegaly, soft, non-tender and no hernia  Pelvis:  External genitalia: normal general appearance Urinary system: urethral meatus normal and bladder without fullness, nontender Vaginal: normal without tenderness, induration or masses Cervix: normal appearance Adnexa: normal bimanual exam Uterus: anteverted and non-tender, normal size    I have spent a total of 15 minutes of face-to-face time, excluding clinical staff time, reviewing notes and preparing to see patient, ordering tests and/or medications, and counseling the patient.  Data Reviewed Wet Prep  Assessment     1. Vaginal discharge Rx: - Cervicovaginal ancillary only( [place])    Plan   Follow up prn     Shelly Bombard, MD 02/21/2021 2:58 PM

## 2021-02-21 NOTE — Progress Notes (Signed)
Pt states vaginal itching and yellowish d/c.

## 2021-02-22 LAB — CERVICOVAGINAL ANCILLARY ONLY
Bacterial Vaginitis (gardnerella): NEGATIVE
Candida Glabrata: NEGATIVE
Candida Vaginitis: NEGATIVE
Comment: NEGATIVE
Comment: NEGATIVE
Comment: NEGATIVE
Comment: NEGATIVE
Trichomonas: NEGATIVE

## 2021-03-08 ENCOUNTER — Ambulatory Visit: Payer: BC Managed Care – PPO | Admitting: Obstetrics

## 2021-03-14 ENCOUNTER — Ambulatory Visit: Payer: BC Managed Care – PPO | Admitting: Obstetrics

## 2021-04-22 ENCOUNTER — Encounter (HOSPITAL_COMMUNITY): Payer: Self-pay

## 2021-04-22 ENCOUNTER — Other Ambulatory Visit: Payer: Self-pay

## 2021-04-22 ENCOUNTER — Emergency Department (HOSPITAL_COMMUNITY): Payer: BC Managed Care – PPO

## 2021-04-22 ENCOUNTER — Emergency Department (HOSPITAL_COMMUNITY)
Admission: EM | Admit: 2021-04-22 | Discharge: 2021-04-22 | Disposition: A | Discharge status: Home / Self Care | Payer: BC Managed Care – PPO | Attending: Emergency Medicine | Admitting: Emergency Medicine

## 2021-04-22 DIAGNOSIS — R519 Headache, unspecified: Secondary | ICD-10-CM | POA: Diagnosis not present

## 2021-04-22 DIAGNOSIS — R42 Dizziness and giddiness: Secondary | ICD-10-CM | POA: Diagnosis not present

## 2021-04-22 MED ORDER — METOCLOPRAMIDE HCL 5 MG/ML IJ SOLN
5.0000 mg | Freq: Once | INTRAMUSCULAR | Status: AC
  Administered 2021-04-22: 11:00:00 5 mg via INTRAMUSCULAR
  Filled 2021-04-22: qty 2

## 2021-04-22 NOTE — ED Provider Notes (Signed)
We will just [place] DEPT Provider Note   CSN: [id number] Arrival date & time: 04/22/21  1006     History Chief Complaint  Patient presents with   Headache   Dizziness    Misty Flynn is a 50 y.o. female.  50 year old female presents with headache x2 weeks.  States that it is somewhat better towards the latter portion of the day.  Has had nausea no vomiting.  No fever or chills.  No neck pain.  No gait abnormalities.  No prior history of same.  Denies any photophobia.  Using over-the-counter medications without relief.      Past Medical History:  Diagnosis Date   History of chicken pox    HSV (herpes simplex virus) infection     Patient Active Problem List   Diagnosis Date Noted   Genital herpes 09/02/2013    Past Surgical History:  Procedure Laterality Date   CESAREAN SECTION     HERNIA REPAIR       OB History     Gravida  3   Para  0   Term  0   Preterm      AB  2   Living  1      SAB      IAB  2   Ectopic      Multiple      Live Births              Family History  Problem Relation Age of Onset   Diabetes Maternal Grandmother    Cancer Maternal Grandfather        prostate   Cancer - Prostate Father     Social History   Tobacco Use   Smoking status: Never   Smokeless tobacco: Never  Vaping Use   Vaping Use: Never used  Substance Use Topics   Alcohol use: No    Alcohol/week: 0.0 standard drinks   Drug use: No    Home Medications Prior to Admission medications   Medication Sig Start Date End Date Taking? Authorizing Provider  BLISOVI 24 FE 1-20 MG-MCG(24) tablet Take 1 tablet by mouth daily. 02/16/21   Anyanwu, Sallyanne Havers, MD  Cyanocobalamin (B-12 PO) Take 1 tablet by mouth daily. Patient not taking: Reported on 07/27/2020    [provider]  doxycycline (VIBRA-TABS) 100 MG tablet Take 1 tablet (100 mg total) by mouth 2 (two) times daily. 06/22/20   Lucretia Kern, DO  ibuprofen  (ADVIL) 800 MG tablet Take 1 tablet (800 mg total) by mouth every 8 (eight) hours as needed for cramping. 07/28/20   Shelly Bombard, MD  Prenatal 27-1 MG TABS Take 1 tablet by mouth daily before breakfast. 07/28/20   Shelly Bombard, MD  valACYclovir (VALTREX) 500 MG tablet Take 1 tablet (500 mg total) by mouth 2 (two) times daily. With symptoms of outbreak. 08/20/19   Shelly Bombard, MD    Allergies    Patient has no known allergies.  Review of Systems   Review of Systems  All other systems reviewed and are negative.  Physical Exam Updated Vital Signs BP 136/85 (BP Location: Left Arm)   Pulse 81   Temp 98.5 F (36.9 C) (Oral)   Resp 16   Ht 1.6 m (5\' 3" )   Wt 62.6 kg   LMP 03/26/2021 (Approximate)   SpO2 99%   BMI 24.45 kg/m   Physical Exam Vitals and nursing note reviewed.  Constitutional:  General: She is not in acute distress.    Appearance: Normal appearance. She is well-developed. She is not toxic-appearing.  HENT:     Head: Normocephalic and atraumatic.  Eyes:     General: Lids are normal.     Conjunctiva/sclera: Conjunctivae normal.     Pupils: Pupils are equal, round, and reactive to light.  Neck:     Thyroid: No thyroid mass.     Trachea: No tracheal deviation.  Cardiovascular:     Rate and Rhythm: Normal rate and regular rhythm.     Heart sounds: Normal heart sounds. No murmur heard.   No gallop.  Pulmonary:     Effort: Pulmonary effort is normal. No respiratory distress.     Breath sounds: Normal breath sounds. No stridor. No decreased breath sounds, wheezing, rhonchi or rales.  Abdominal:     General: There is no distension.     Palpations: Abdomen is soft.     Tenderness: There is no abdominal tenderness. There is no rebound.  Musculoskeletal:        General: No tenderness. Normal range of motion.     Cervical back: Normal range of motion and neck supple.  Skin:    General: Skin is warm and dry.     Findings: No abrasion or rash.   Neurological:     General: No focal deficit present.     Mental Status: She is alert and oriented to person, place, and time. Mental status is at baseline.     GCS: GCS eye subscore is 4. GCS verbal subscore is 5. GCS motor subscore is 6.     Cranial Nerves: Cranial nerves are intact. No cranial nerve deficit.     Sensory: No sensory deficit.     Motor: Motor function is intact.  Psychiatric:        Attention and Perception: Attention normal.        Speech: Speech normal.        Behavior: Behavior normal.    ED Results / Procedures / Treatments   Labs (all labs ordered are listed, but only abnormal results are displayed) Labs Reviewed - No data to display  EKG None  Radiology No results found.  Procedures Procedures   Medications Ordered in ED Medications  metoCLOPramide (REGLAN) injection 5 mg (has no administration in time range)    ED Course  I have reviewed the triage vital signs and the nursing notes.  Pertinent labs & imaging results that were available during my care of the patient were reviewed by me and considered in my medical decision making (see chart for details).    MDM Rules/Calculators/A&P                          Head CT without acute findings here.  Medicated with Reglan feels better.  Will discharge home final Clinical Impression(s) / ED Diagnoses Final diagnoses:  None    Rx / DC Orders ED Discharge Orders     None        Lacretia Leigh, MD 04/22/21 1221

## 2021-04-22 NOTE — ED Triage Notes (Signed)
Patient c/o headache x 2 weeks. Patient also c/o intermittent dizziness x 2 days only. Patient denies blurred vision.

## 2021-04-23 ENCOUNTER — Encounter: Payer: Self-pay | Admitting: Obstetrics

## 2021-04-23 ENCOUNTER — Ambulatory Visit (INDEPENDENT_AMBULATORY_CARE_PROVIDER_SITE_OTHER): Payer: BC Managed Care – PPO | Admitting: Obstetrics

## 2021-04-23 ENCOUNTER — Other Ambulatory Visit (HOSPITAL_COMMUNITY)
Admission: RE | Admit: 2021-04-23 | Discharge: 2021-04-23 | Disposition: A | Discharge status: Home / Self Care | Payer: BC Managed Care – PPO | Source: Ambulatory Visit | Attending: Obstetrics | Admitting: Obstetrics

## 2021-04-23 VITALS — BP 139/92 | HR 82 | Ht 63.0 in | Wt 139.9 lb

## 2021-04-23 DIAGNOSIS — Z1239 Encounter for other screening for malignant neoplasm of breast: Secondary | ICD-10-CM

## 2021-04-23 DIAGNOSIS — N926 Irregular menstruation, unspecified: Secondary | ICD-10-CM | POA: Diagnosis not present

## 2021-04-23 DIAGNOSIS — R030 Elevated blood-pressure reading, without diagnosis of hypertension: Secondary | ICD-10-CM

## 2021-04-23 DIAGNOSIS — N946 Dysmenorrhea, unspecified: Secondary | ICD-10-CM

## 2021-04-23 DIAGNOSIS — R519 Headache, unspecified: Secondary | ICD-10-CM | POA: Diagnosis not present

## 2021-04-23 DIAGNOSIS — Z01419 Encounter for gynecological examination (general) (routine) without abnormal findings: Secondary | ICD-10-CM | POA: Insufficient documentation

## 2021-04-23 DIAGNOSIS — A6004 Herpesviral vulvovaginitis: Secondary | ICD-10-CM

## 2021-04-23 DIAGNOSIS — Z3041 Encounter for surveillance of contraceptive pills: Secondary | ICD-10-CM

## 2021-04-23 MED ORDER — VALACYCLOVIR HCL 500 MG PO TABS: 500.0000 mg | ORAL_TABLET | Freq: Two times a day (BID) | ORAL | 5 refills | Status: DC

## 2021-04-23 MED ORDER — IBUPROFEN 800 MG PO TABS: 800.0000 mg | ORAL_TABLET | Freq: Three times a day (TID) | ORAL | 5 refills | Status: DC | PRN

## 2021-04-23 NOTE — Progress Notes (Signed)
Patient presents for AEX. Patient complains of having really bad headaches and having irregular cycles. She states that she has not had a cycle for the month of June.   Last pap: 08/20/2019 Normal MM results: 09/2020 Normal

## 2021-04-23 NOTE — Progress Notes (Signed)
Subjective:        Misty Flynn is a 50 y.o. female here for a routine exam.  Current complaints: Irregular periods, and .severe headaches for the past 2 weeks.    Personal health questionnaire:  Is patient [name], have a family history of breast and/or ovarian cancer: no Is there a family history of uterine cancer diagnosed at age < 84, gastrointestinal cancer, urinary tract cancer, family member who is a Field seismologist syndrome-associated carrier: no Is the patient overweight and hypertensive, family history of diabetes, personal history of gestational diabetes, preeclampsia or PCOS: no Is patient over 53, have PCOS,  family history of premature CHD under age 32, diabetes, smoke, have hypertension or peripheral artery disease:  no At any time, has a partner hit, kicked or otherwise hurt or frightened you?: no Over the past 2 weeks, have you felt down, depressed or hopeless?: no Over the past 2 weeks, have you felt little interest or pleasure in doing things?:no   Gynecologic History Patient's last menstrual period was 03/16/2021 (approximate). Contraception: OCP (estrogen/progesterone) Last Pap: 08-20-2019. Results were: normal Last mammogram: 2019. Results were: normal  Obstetric History OB History  Gravida Para Term Preterm AB Living  3 0 0   2 1  SAB IAB Ectopic Multiple Live Births    2     1    # Outcome Date GA Lbr Len/2nd Weight Sex Delivery Anes PTL Lv  3 IAB           2 Gravida      CS-LTranv     1 IAB             Past Medical History:  Diagnosis Date   History of chicken pox    HSV (herpes simplex virus) infection     Past Surgical History:  Procedure Laterality Date   CESAREAN SECTION     HERNIA REPAIR       Current Outpatient Medications:    Cyanocobalamin (B-12 PO), Take 1 tablet by mouth daily., Disp: , Rfl:    ibuprofen (ADVIL) 800 MG tablet, Take 1 tablet (800 mg total) by mouth every 8 (eight) hours as needed for cramping., Disp: 30 tablet,  Rfl: 5   valACYclovir (VALTREX) 500 MG tablet, Take 1 tablet (500 mg total) by mouth 2 (two) times daily. With symptoms of outbreak., Disp: 30 tablet, Rfl: 5 No Known Allergies  Social History   Tobacco Use   Smoking status: Never   Smokeless tobacco: Never  Substance Use Topics   Alcohol use: No    Alcohol/week: 0.0 standard drinks    Family History  Problem Relation Age of Onset   Diabetes Maternal Grandmother    Cancer Maternal Grandfather        prostate   Cancer - Prostate Father       Review of Systems  Constitutional: negative for fatigue and weight loss Respiratory: negative for cough and wheezing Cardiovascular: negative for chest pain, fatigue and palpitations Gastrointestinal: negative for abdominal pain and change in bowel habits Musculoskeletal:negative for myalgias Neurological: positive for new onset severe headaches.  negative for gait problems and tremors Behavioral/Psych: negative for abusive relationship, depression Endocrine: negative for temperature intolerance    Genitourinary:positive for irregular menstrual periods.  Negative forgenital lesions, hot flashes, sexual problems and vaginal discharge Integument/breast: negative for breast lump, breast tenderness, nipple discharge and skin lesion(s)    Objective:       BP (!) 139/92   Pulse 82   Ht  5\' 3"  (1.6 m)   Wt 139 lb 14.4 oz (63.5 kg)   LMP 03/16/2021 (Approximate)   BMI 24.78 kg/m  General:   Alert and no distress  Skin:   no rash or abnormalities  Lungs:   clear to auscultation bilaterally  Heart:   regular rate and rhythm, S1, S2 normal, no murmur, click, rub or gallop  Breasts:   normal without suspicious masses, skin or nipple changes or axillary nodes  Abdomen:  normal findings: no organomegaly, soft, non-tender and no hernia  Pelvis:  External genitalia: normal general appearance Urinary system: urethral meatus normal and bladder without fullness, nontender Vaginal: normal without  tenderness, induration or masses Cervix: normal appearance Adnexa: normal bimanual exam Uterus: anteverted and non-tender, normal size   Lab Review Urine pregnancy test Labs reviewed yes Radiologic studies reviewed yes    Assessment:   1. Encounter for gynecological examination with Papanicolaou smear of cervix Rx: - Cytology - PAP( [place])  2. Dysmenorrhea Rx: - ibuprofen (ADVIL) 800 MG tablet; Take 1 tablet (800 mg total) by mouth every 8 (eight) hours as needed for cramping.  Dispense: 30 tablet; Refill: 5  3. Irregular periods - may be related to the perimenopause - will observe clinically off hormones  4. New onset of headaches Rx: - Ambulatory referral to Neurology  5. Elevated BP without diagnosis of hypertension - borderline - referred to Internal Medicine for further management  6. Encounter for surveillance of contraceptive pills - hormonal contraception discontinued due to severe HA's and possible increased risks of strokes and clotting events  7. Herpes simplex vulvovaginitis Rx: - valACYclovir (VALTREX) 500 MG tablet; Take 1 tablet (500 mg total) by mouth 2 (two) times daily. With symptoms of outbreak.  Dispense: 30 tablet; Refill: 5   8. Screening breast examination - digital mammogram screening ordered    Plan:    Education reviewed: calcium supplements, depression evaluation, low fat, low cholesterol diet, safe sex/STD prevention, self breast exams, and weight bearing exercise. Contraception: condoms. Follow up in: 3 months.   Meds ordered this encounter  Medications   ibuprofen (ADVIL) 800 MG tablet    Sig: Take 1 tablet (800 mg total) by mouth every 8 (eight) hours as needed for cramping.    Dispense:  30 tablet    Refill:  5   valACYclovir (VALTREX) 500 MG tablet    Sig: Take 1 tablet (500 mg total) by mouth 2 (two) times daily. With symptoms of outbreak.    Dispense:  30 tablet    Refill:  5   Orders Placed This Encounter   Procedures   MM Digital Screening    Standing Status:   Future    Standing Expiration Date:   04/23/2022    Order Specific Question:   Reason for Exam (SYMPTOM  OR DIAGNOSIS REQUIRED)    Answer:   Screening    Order Specific Question:   Is the patient pregnant?    Answer:   No    Order Specific Question:   Preferred imaging location?    Answer:   [facility]   Ambulatory referral to Neurology    Referral Priority:   Routine    Referral Type:   Consultation    Referral Reason:   Specialty Services Required    Requested Specialty:   Neurology    Number of Visits Requested:   1    Shelly Bombard, MD 04/23/2021 10:39 PM

## 2021-04-24 LAB — CYTOLOGY - PAP
Comment: NEGATIVE
Diagnosis: NEGATIVE
High risk HPV: NEGATIVE

## 2021-05-10 ENCOUNTER — Ambulatory Visit: Payer: BC Managed Care – PPO | Admitting: Internal Medicine

## 2021-06-01 ENCOUNTER — Ambulatory Visit: Payer: BC Managed Care – PPO | Admitting: Internal Medicine

## 2021-06-05 ENCOUNTER — Encounter: Payer: Self-pay | Admitting: Internal Medicine

## 2021-06-05 ENCOUNTER — Telehealth (INDEPENDENT_AMBULATORY_CARE_PROVIDER_SITE_OTHER): Payer: BC Managed Care – PPO | Admitting: Internal Medicine

## 2021-06-05 VITALS — Wt 140.0 lb

## 2021-06-05 DIAGNOSIS — R197 Diarrhea, unspecified: Secondary | ICD-10-CM | POA: Diagnosis not present

## 2021-06-05 NOTE — Progress Notes (Signed)
Virtual Visit via Video Note  I connected with Misty Flynn on 06/05/21 at  4:00 PM EDT by a video enabled telemedicine application and verified that I am speaking with the correct person using two identifiers.  Location patient: home Location provider: work office Persons participating in the virtual visit: patient, provider  I discussed the limitations of evaluation and management by telemedicine and the availability of in person appointments. The patient expressed understanding and agreed to proceed.   HPI: Ate at [employer] last weekend. Day after started having diarrhea, 3 episodes a day that she describes as watery and green. She has some nausea as well. Husband had similar symptoms but got better quickly. She is concerned as she will be going out of town for a funeral.   ROS: Constitutional: Denies fever, chills, diaphoresis, appetite change and fatigue.  HEENT: Denies photophobia, eye pain, redness, hearing loss, ear pain, congestion, sore throat, rhinorrhea, sneezing, mouth sores, trouble swallowing, neck pain, neck stiffness and tinnitus.   Respiratory: Denies SOB, DOE, cough, chest tightness,  and wheezing.   Cardiovascular: Denies chest pain, palpitations and leg swelling.  Gastrointestinal: Deniesconstipation, blood in stool and abdominal distention.  Genitourinary: Denies dysuria, urgency, frequency, hematuria, flank pain and difficulty urinating.  Endocrine: Denies: hot or cold intolerance, sweats, changes in hair or nails, polyuria, polydipsia. Musculoskeletal: Denies myalgias, back pain, joint swelling, arthralgias and gait problem.  Skin: Denies pallor, rash and wound.  Neurological: Denies dizziness, seizures, syncope, weakness, light-headedness, numbness and headaches.  Hematological: Denies adenopathy. Easy bruising, personal or family bleeding history  Psychiatric/Behavioral: Denies suicidal ideation, mood changes, confusion, nervousness, sleep disturbance  and agitation   Past Medical History:  Diagnosis Date   History of chicken pox    HSV (herpes simplex virus) infection     Past Surgical History:  Procedure Laterality Date   CESAREAN SECTION     HERNIA REPAIR      Family History  Problem Relation Age of Onset   Diabetes Maternal Grandmother    Cancer Maternal Grandfather        prostate   Cancer - Prostate Father     SOCIAL HX:   reports that she has never smoked. She has never used smokeless tobacco. She reports that she does not drink alcohol and does not use drugs.   Current Outpatient Medications:    Cyanocobalamin (B-12 PO), Take 1 tablet by mouth daily., Disp: , Rfl:    ibuprofen (ADVIL) 800 MG tablet, Take 1 tablet (800 mg total) by mouth every 8 (eight) hours as needed for cramping., Disp: 30 tablet, Rfl: 5   valACYclovir (VALTREX) 500 MG tablet, Take 1 tablet (500 mg total) by mouth 2 (two) times daily. With symptoms of outbreak., Disp: 30 tablet, Rfl: 5  EXAM:   VITALS per patient if applicable: none reported  GENERAL: alert, oriented, appears well and in no acute distress  HEENT: atraumatic, conjunttiva clear, no obvious abnormalities on inspection of external nose and ears  NECK: normal movements of the head and neck  LUNGS: on inspection no signs of respiratory distress, breathing rate appears normal, no obvious gross increased work of breathing, gasping or wheezing  CV: no obvious cyanosis  MS: moves all visible extremities without noticeable abnormality  PSYCH/NEURO: pleasant and cooperative, no obvious depression or anxiety, speech and thought processing grossly intact  ASSESSMENT AND PLAN:   Diarrhea of presumed infectious origin -Suspect acute viral gastroenteritis vs food poisoning -Ok to use imodium if not better in  1 week. -Due for initial screening c-scope so will place GI referral.    I discussed the assessment and treatment plan with the patient. The patient was provided an  opportunity to ask questions and all were answered. The patient agreed with the plan and demonstrated an understanding of the instructions.   The patient was advised to call back or seek an in-person evaluation if the symptoms worsen or if the condition fails to improve as anticipated.    Lelon Frohlich, MD  [facility] at [facility]

## 2021-06-28 ENCOUNTER — Ambulatory Visit: Payer: BC Managed Care – PPO | Admitting: Internal Medicine

## 2021-07-19 ENCOUNTER — Ambulatory Visit (INDEPENDENT_AMBULATORY_CARE_PROVIDER_SITE_OTHER): Payer: BC Managed Care – PPO | Admitting: Internal Medicine

## 2021-07-19 ENCOUNTER — Other Ambulatory Visit: Payer: Self-pay

## 2021-07-19 ENCOUNTER — Encounter: Payer: Self-pay | Admitting: Internal Medicine

## 2021-07-19 VITALS — BP 110/70 | HR 95 | Temp 98.1°F | Ht 63.0 in | Wt 137.8 lb

## 2021-07-19 DIAGNOSIS — K6389 Other specified diseases of intestine: Secondary | ICD-10-CM | POA: Diagnosis not present

## 2021-07-19 DIAGNOSIS — Z0000 Encounter for general adult medical examination without abnormal findings: Secondary | ICD-10-CM | POA: Diagnosis not present

## 2021-07-19 DIAGNOSIS — Z1211 Encounter for screening for malignant neoplasm of colon: Secondary | ICD-10-CM | POA: Diagnosis not present

## 2021-07-19 LAB — CBC WITH DIFFERENTIAL/PLATELET
Basophils Absolute: 0 10*3/uL (ref 0.0–0.1)
Basophils Relative: 0.3 % (ref 0.0–3.0)
Eosinophils Absolute: 0.1 10*3/uL (ref 0.0–0.7)
Eosinophils Relative: 2.4 % (ref 0.0–5.0)
HCT: 37.2 % (ref 36.0–46.0)
Hemoglobin: 12 g/dL (ref 12.0–15.0)
Lymphocytes Relative: 41.1 % (ref 12.0–46.0)
Lymphs Abs: 2.2 10*3/uL (ref 0.7–4.0)
MCHC: 32.2 g/dL (ref 30.0–36.0)
MCV: 85.4 fl (ref 78.0–100.0)
Monocytes Absolute: 0.4 10*3/uL (ref 0.1–1.0)
Monocytes Relative: 7.9 % (ref 3.0–12.0)
Neutro Abs: 2.5 10*3/uL (ref 1.4–7.7)
Neutrophils Relative %: 48.3 % (ref 43.0–77.0)
Platelets: 315 10*3/uL (ref 150.0–400.0)
RBC: 4.36 Mil/uL (ref 3.87–5.11)
RDW: 13.9 % (ref 11.5–15.5)
WBC: 5.2 10*3/uL (ref 4.0–10.5)

## 2021-07-19 LAB — LIPID PANEL
Cholesterol: 165 mg/dL (ref 0–200)
HDL: 50.9 mg/dL (ref >39.00)
LDL Cholesterol: 101 mg/dL — ABNORMAL HIGH (ref 0–99)
NonHDL: 114.45
Total CHOL/HDL Ratio: 3
Triglycerides: 67 mg/dL (ref 0.0–149.0)
VLDL: 13.4 mg/dL (ref 0.0–40.0)

## 2021-07-19 LAB — COMPREHENSIVE METABOLIC PANEL
ALT: 12 U/L (ref 0–35)
AST: 18 U/L (ref 0–37)
Albumin: 4 g/dL (ref 3.5–5.2)
Alkaline Phosphatase: 52 U/L (ref 39–117)
BUN: 9 mg/dL (ref 6–23)
CO2: 27 mEq/L (ref 19–32)
Calcium: 9 mg/dL (ref 8.4–10.5)
Chloride: 102 mEq/L (ref 96–112)
Creatinine, Ser: 0.82 mg/dL (ref 0.40–1.20)
GFR: 84.99 mL/min (ref >60.00)
Glucose, Bld: 88 mg/dL (ref 70–99)
Potassium: 3.8 mEq/L (ref 3.5–5.1)
Sodium: 137 mEq/L (ref 135–145)
Total Bilirubin: 0.3 mg/dL (ref 0.2–1.2)
Total Protein: 6.8 g/dL (ref 6.0–8.3)

## 2021-07-19 LAB — TSH: TSH: 0.87 u[IU]/mL (ref 0.35–5.50)

## 2021-07-19 LAB — HEMOGLOBIN A1C: Hgb A1c MFr Bld: 5.7 % (ref 4.6–6.5)

## 2021-07-19 MED ORDER — SACCHAROMYCES BOULARDII 250 MG PO CAPS: 250.0000 mg | ORAL_CAPSULE | Freq: Two times a day (BID) | ORAL | 2 refills | Status: AC

## 2021-07-19 NOTE — Progress Notes (Signed)
Established Patient Office Visit     This visit occurred during the SARS-CoV-2 public health emergency.  Safety protocols were in place, including screening questions prior to the visit, additional usage of staff PPE, and extensive cleaning of exam room while observing appropriate contact time as indicated for disinfecting solutions.    CC/Reason for Visit: Annual preventive exam  HPI: Misty Flynn is a 50 y.o. female who is coming in today for the above mentioned reasons. Past Medical History is significant for: Genital herpes and mild iron deficiency anemia.  She went to [place] over the summer and ever since then has been having bowel issues, mainly loose stool.  For a few weeks her stools were green and voluminous, now they are brown in color but still quite loose.  She is overdue for initial screening colonoscopy.  Pap smear and mammogram are up-to-date.  She is due for COVID booster and her flu vaccination.  She has routine dental care but is overdue for an eye exam, she walks on a daily basis.   Past Medical/Surgical History: Past Medical History:  Diagnosis Date   History of chicken pox    HSV (herpes simplex virus) infection     Past Surgical History:  Procedure Laterality Date   CESAREAN SECTION     HERNIA REPAIR      Social History:  reports that she has never smoked. She has never used smokeless tobacco. She reports that she does not drink alcohol and does not use drugs.  Allergies: No Known Allergies  Family History:  Family History  Problem Relation Age of Onset   Diabetes Maternal Grandmother    Cancer Maternal Grandfather        prostate   Cancer - Prostate Father      Current Outpatient Medications:    Cyanocobalamin (B-12 PO), Take 1 tablet by mouth daily., Disp: , Rfl:    ibuprofen (ADVIL) 800 MG tablet, Take 1 tablet (800 mg total) by mouth every 8 (eight) hours as needed for cramping., Disp: 30 tablet, Rfl: 5   saccharomyces boulardii  (FLORASTOR) 250 MG capsule, Take 1 capsule (250 mg total) by mouth 2 (two) times daily., Disp: 60 capsule, Rfl: 2   valACYclovir (VALTREX) 500 MG tablet, Take 1 tablet (500 mg total) by mouth 2 (two) times daily. With symptoms of outbreak., Disp: 30 tablet, Rfl: 5  Review of Systems:  Constitutional: Denies fever, chills, diaphoresis, appetite change and fatigue.  HEENT: Denies photophobia, eye pain, redness, hearing loss, ear pain, congestion, sore throat, rhinorrhea, sneezing, mouth sores, trouble swallowing, neck pain, neck stiffness and tinnitus.   Respiratory: Denies SOB, DOE, cough, chest tightness,  and wheezing.   Cardiovascular: Denies chest pain, palpitations and leg swelling.  Gastrointestinal: Denies nausea, vomiting, abdominal pain,  constipation, blood in stool and abdominal distention.  Genitourinary: Denies dysuria, urgency, frequency, hematuria, flank pain and difficulty urinating.  Endocrine: Denies: hot or cold intolerance, sweats, changes in hair or nails, polyuria, polydipsia. Musculoskeletal: Denies myalgias, back pain, joint swelling, arthralgias and gait problem.  Skin: Denies pallor, rash and wound.  Neurological: Denies dizziness, seizures, syncope, weakness, light-headedness, numbness and headaches.  Hematological: Denies adenopathy. Easy bruising, personal or family bleeding history  Psychiatric/Behavioral: Denies suicidal ideation, mood changes, confusion, nervousness, sleep disturbance and agitation    Physical Exam: Vitals:   07/19/21 1000  BP: 110/70  Pulse: 95  Temp: 98.1 F (36.7 C)  TempSrc: Oral  SpO2: 97%  Weight: 137 lb 12.8 oz (62.5  kg)  Height: 5\' 3"  (1.6 m)    Body mass index is 24.41 kg/m.   Constitutional: NAD, calm, comfortable Eyes: PERRL, lids and conjunctivae normal, wears corrective lenses ENMT: Mucous membranes are moist. Posterior pharynx clear of any exudate or lesions. Normal dentition. Tympanic membrane is pearly white, no  erythema or bulging. Neck: normal, supple, no masses, no thyromegaly Respiratory: clear to auscultation bilaterally, no wheezing, no crackles. Normal respiratory effort. No accessory muscle use.  Cardiovascular: Regular rate and rhythm, no murmurs / rubs / gallops. No extremity edema. 2+ pedal pulses. No carotid bruits.  Abdomen: no tenderness, no masses palpated. No hepatosplenomegaly. Bowel sounds positive.  Musculoskeletal: no clubbing / cyanosis. No joint deformity upper and lower extremities. Good ROM, no contractures. Normal muscle tone.  Skin: no rashes, lesions, ulcers. No induration Neurologic: CN 2-12 grossly intact. Sensation intact, DTR normal. Strength 5/5 in all 4.  Psychiatric: Normal judgment and insight. Alert and oriented x 3. Normal mood.    Impression and Plan:  Encounter for preventive health examination  -Advised routine eye and dental care. -Have advised flu and COVID booster but she declines. -Healthy lifestyle discussed in detail. -Labs will be updated today. -She is overdue for screening colonoscopy, will initiate GI referral. -She had a mammogram in December 2021 that was normal. -She had a Pap smear with her GYN 2 months ago.  Screening for malignant neoplasm of colon  - Plan: Ambulatory referral to Gastroenterology  Small intestinal bacterial overgrowth (SIBO)  - Plan: saccharomyces boulardii (FLORASTOR) 250 MG capsule    Patient Instructions  -Nice seeing you today!!  -Lab work today; will notify you once results are available.  -Consider your flu and COVID booster.  -Start florastor 250 mg twice daily.  -Schedule follow up in 1 year or sooner as needed.      Lelon Frohlich, MD [facility]

## 2021-07-19 NOTE — Patient Instructions (Signed)
-  Nice seeing you today!!  -Lab work today; will notify you once results are available.  -Consider your flu and COVID booster.  -Start florastor 250 mg twice daily.  -Schedule follow up in 1 year or sooner as needed.

## 2021-07-19 NOTE — Addendum Note (Signed)
Addended by: Amanda Cockayne on: 07/19/2021 10:25 AM   Modules accepted: Orders

## 2021-07-22 ENCOUNTER — Other Ambulatory Visit: Payer: Self-pay | Admitting: Obstetrics

## 2021-07-22 DIAGNOSIS — A6004 Herpesviral vulvovaginitis: Secondary | ICD-10-CM

## 2021-07-31 ENCOUNTER — Telehealth: Payer: Self-pay | Admitting: Internal Medicine

## 2021-07-31 DIAGNOSIS — L821 Other seborrheic keratosis: Secondary | ICD-10-CM | POA: Diagnosis not present

## 2021-07-31 DIAGNOSIS — L810 Postinflammatory hyperpigmentation: Secondary | ICD-10-CM | POA: Diagnosis not present

## 2021-07-31 NOTE — Telephone Encounter (Signed)
Patient called to ask about lab results, as well as speak about prescription sent in. Patient states she was supposed to be on a probiotic but was given a medication she already had. Patient states that this medication is completely different from the probiotic and is incorrect.     Good callback number is [phone]      Please Advise

## 2021-07-31 NOTE — Telephone Encounter (Signed)
Spoke with patient and reviewed lab results. Called [facility] the do not carry saccharomyces boulardii (FLORASTOR) 250 MG capsule.  They only carry Culturelle which is OTC.  Patient has tried this and it does not work.  Please advise.

## 2021-08-01 NOTE — Telephone Encounter (Signed)
Left detailed message on machine for patient to give information to which pharmacy she would like the prescription to go.

## 2021-08-17 DIAGNOSIS — L708 Other acne: Secondary | ICD-10-CM | POA: Diagnosis not present

## 2021-12-03 DIAGNOSIS — Z1231 Encounter for screening mammogram for malignant neoplasm of breast: Secondary | ICD-10-CM | POA: Diagnosis not present

## 2021-12-03 LAB — HM MAMMOGRAPHY

## 2021-12-11 ENCOUNTER — Encounter: Payer: Self-pay | Admitting: Internal Medicine

## 2022-05-07 DIAGNOSIS — H11442 Conjunctival cysts, left eye: Secondary | ICD-10-CM | POA: Diagnosis not present

## 2022-05-14 DIAGNOSIS — D3102 Benign neoplasm of left conjunctiva: Secondary | ICD-10-CM | POA: Diagnosis not present

## 2022-05-14 DIAGNOSIS — H0102B Squamous blepharitis left eye, upper and lower eyelids: Secondary | ICD-10-CM | POA: Diagnosis not present

## 2022-05-14 DIAGNOSIS — H04123 Dry eye syndrome of bilateral lacrimal glands: Secondary | ICD-10-CM | POA: Diagnosis not present

## 2022-05-14 DIAGNOSIS — H0102A Squamous blepharitis right eye, upper and lower eyelids: Secondary | ICD-10-CM | POA: Diagnosis not present

## 2022-05-14 DIAGNOSIS — H04121 Dry eye syndrome of right lacrimal gland: Secondary | ICD-10-CM | POA: Diagnosis not present

## 2022-05-14 DIAGNOSIS — H04122 Dry eye syndrome of left lacrimal gland: Secondary | ICD-10-CM | POA: Diagnosis not present

## 2022-06-20 IMAGING — CT CT HEAD W/O CM
3 series · 16 of 47 positions shown, 19 images · non-contrast
Comparison: None.

CLINICAL DATA: Headache for 2 weeks. Intermittent dizziness for 2
days.

EXAM:
CT HEAD WITHOUT CONTRAST
TECHNIQUE: Contiguous axial images were obtained from the base of the skull
through the vertex without intravenous contrast.

[Series 3: head wo · axial · 0.46mm/px · z∈[+1345,+1480]mm · 10 of 33 slices shown, 13 images]
[im 3/33  brain]
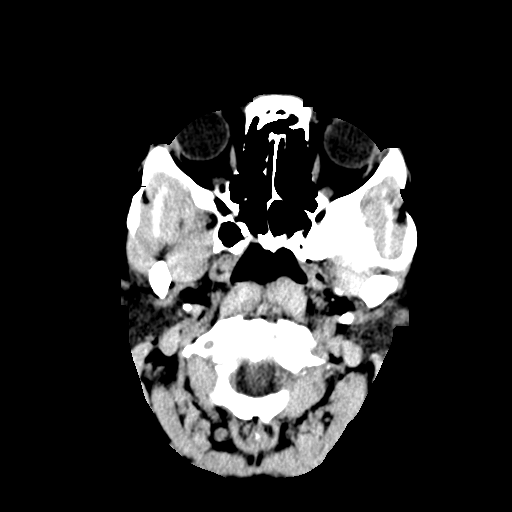
[im 3/33  bone]
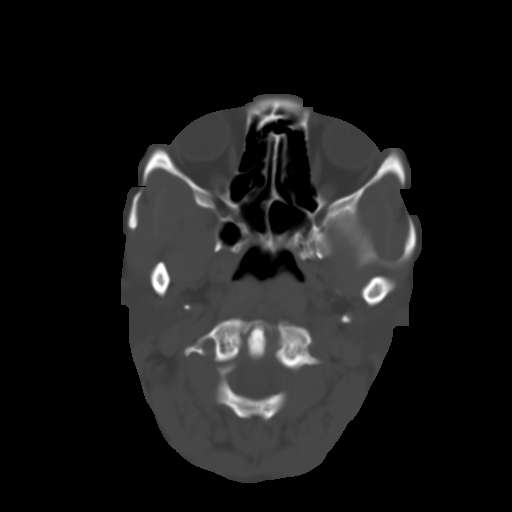
[im 6/33  brain]
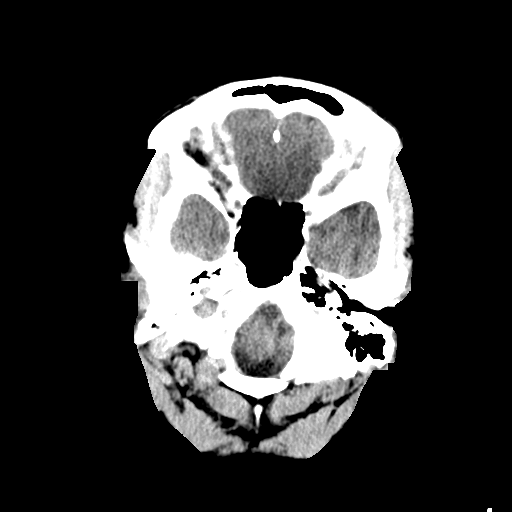
[im 9/33  brain]
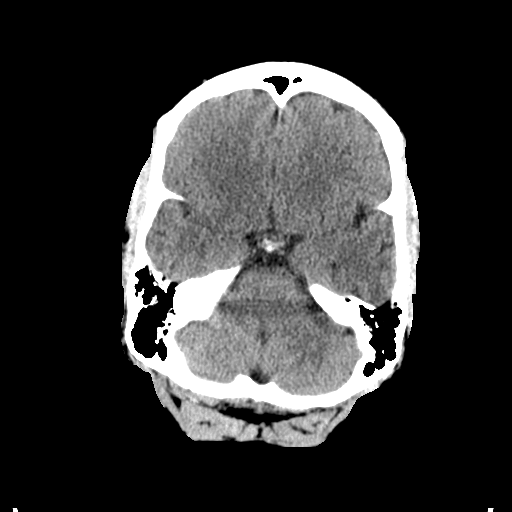
[im 12/33  brain]
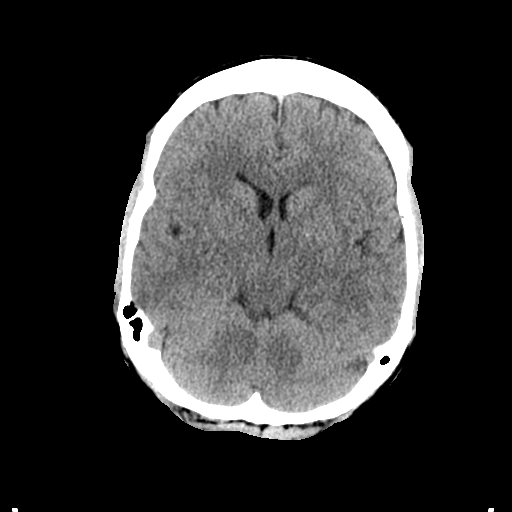
[im 15/33  brain]
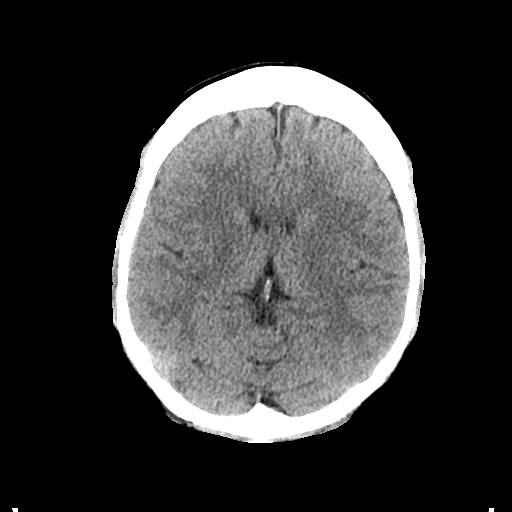
[im 15/33  bone]
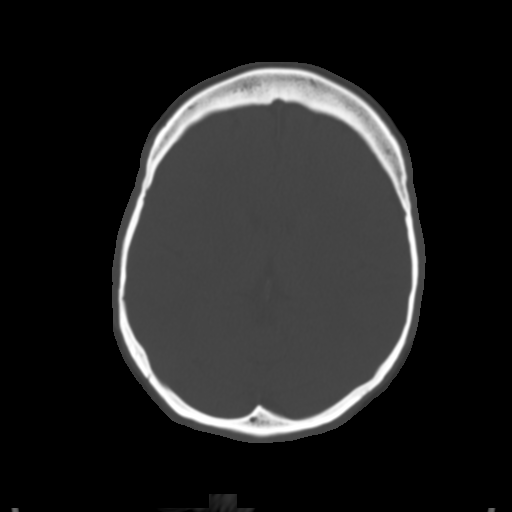
[im 18/33  brain]
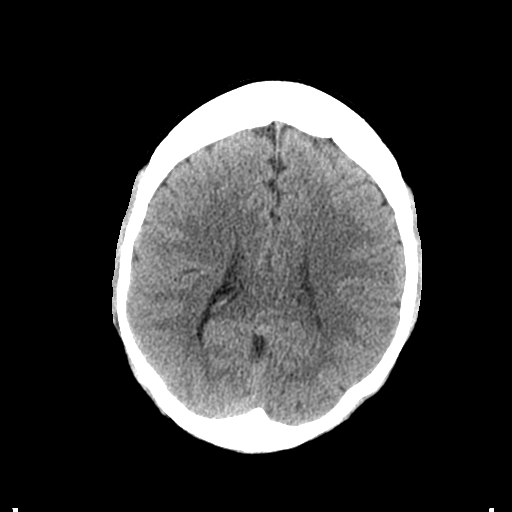
[im 21/33  brain]
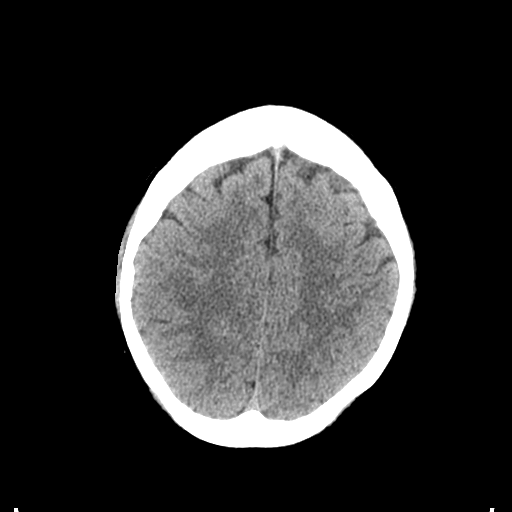
[im 25/33  brain]
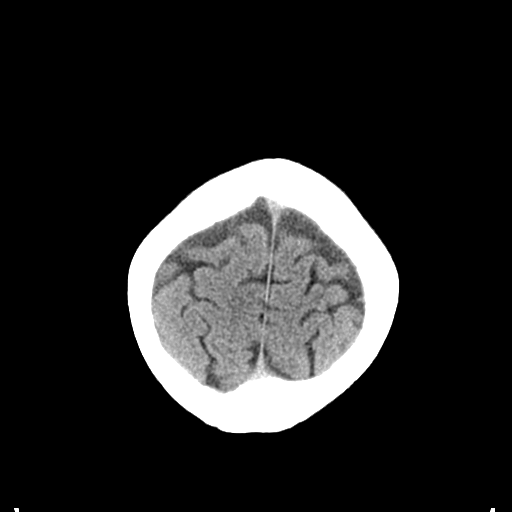
[im 27/33  brain]
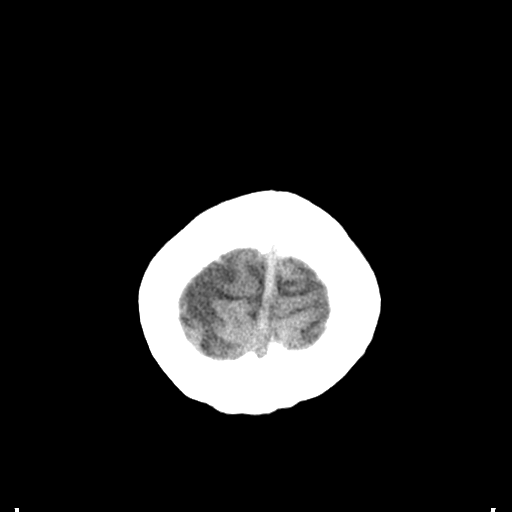
[im 27/33  bone]
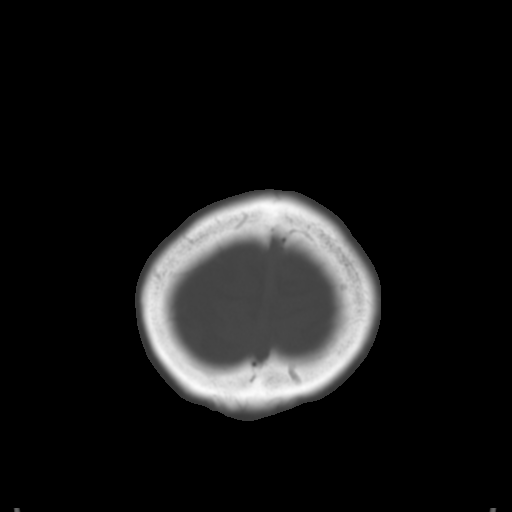
[im 30/33  brain]
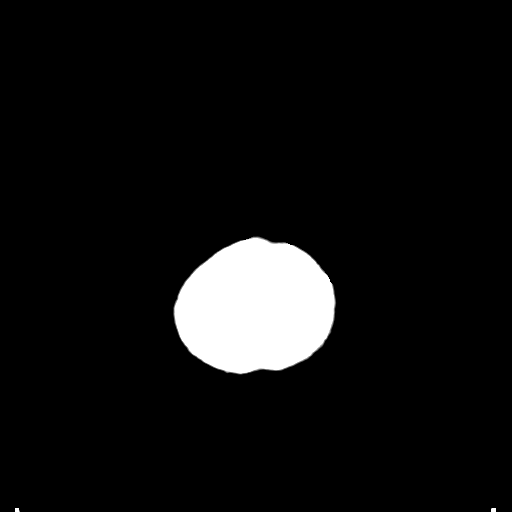

[Series 5: coronal soft tissue · coronal · 0.33mm/px · 3 of 74 slices shown]
[im 26/74  brain]
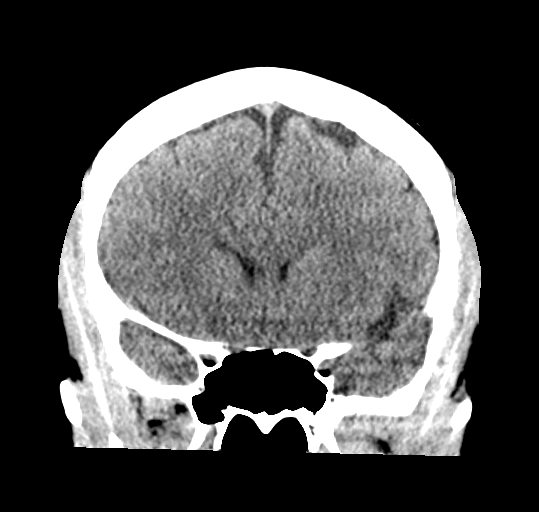
[im 33/74  brain]
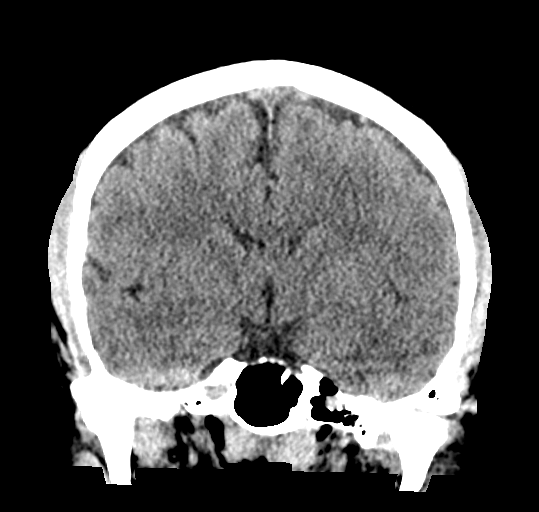
[im 41/74  brain]
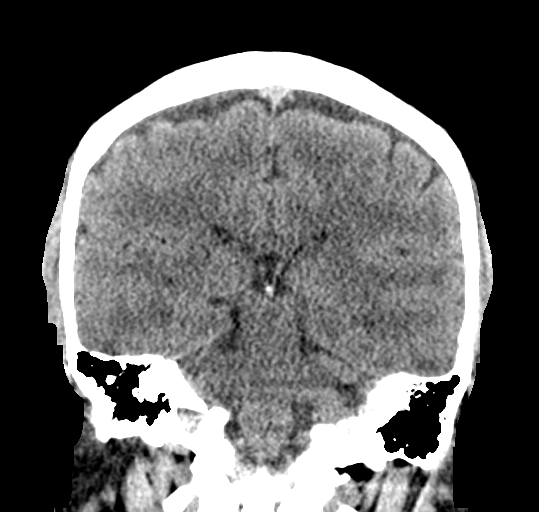

[Series 6: sagittal soft tissue · sagittal · 0.32mm/px · 3 of 59 slices shown]
[im 20/59  brain]
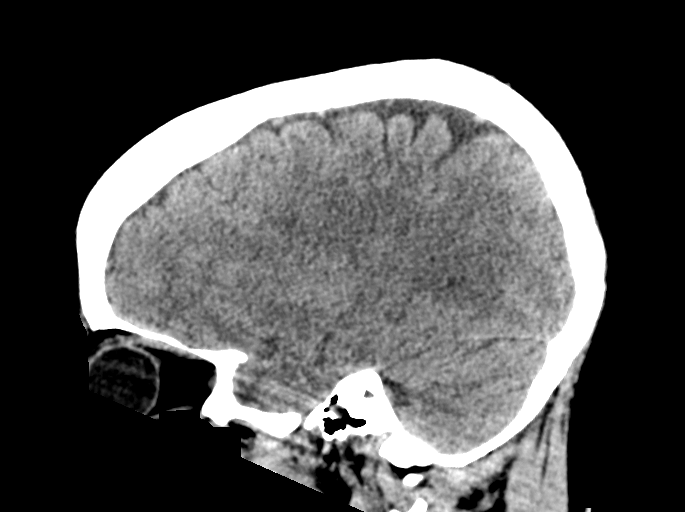
[im 30/59  brain]
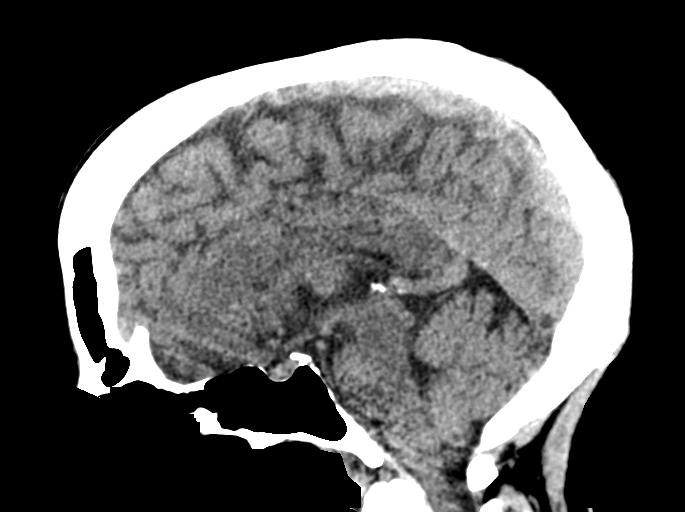
[im 39/59  brain]
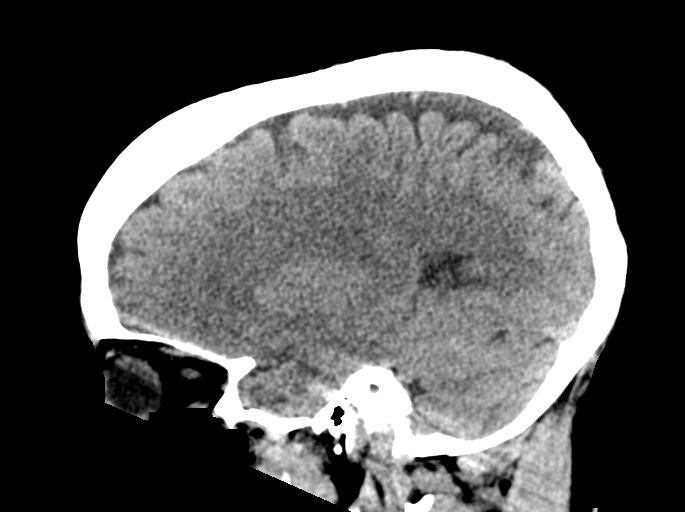

[16 of 47 positions shown; findings below may reference images not displayed]

FINDINGS: Brain: There is no evidence of an acute infarct, intracranial
hemorrhage, mass, midline shift, or extra-axial fluid collection.
The ventricles and sulci are normal.

Vascular: No hyperdense vessel.

Skull: No fracture or suspicious osseous lesion.

Sinuses/Orbits: Visualized paranasal sinuses and mastoid air cells
are clear. Visualized orbits are unremarkable.

Other: None.
IMPRESSION: Negative head CT.

## 2022-07-17 ENCOUNTER — Ambulatory Visit (INDEPENDENT_AMBULATORY_CARE_PROVIDER_SITE_OTHER): Payer: BC Managed Care – PPO | Admitting: Obstetrics and Gynecology

## 2022-07-17 ENCOUNTER — Other Ambulatory Visit (HOSPITAL_COMMUNITY)
Admission: RE | Admit: 2022-07-17 | Discharge: 2022-07-17 | Disposition: A | Discharge status: Home / Self Care | Payer: BC Managed Care – PPO | Source: Ambulatory Visit | Attending: Obstetrics and Gynecology | Admitting: Obstetrics and Gynecology

## 2022-07-17 ENCOUNTER — Encounter: Payer: Self-pay | Admitting: Obstetrics and Gynecology

## 2022-07-17 VITALS — BP 105/67 | HR 80 | Ht 62.0 in | Wt 133.6 lb

## 2022-07-17 DIAGNOSIS — N946 Dysmenorrhea, unspecified: Secondary | ICD-10-CM | POA: Diagnosis not present

## 2022-07-17 DIAGNOSIS — Z01419 Encounter for gynecological examination (general) (routine) without abnormal findings: Secondary | ICD-10-CM | POA: Diagnosis not present

## 2022-07-17 DIAGNOSIS — R1084 Generalized abdominal pain: Secondary | ICD-10-CM

## 2022-07-17 MED ORDER — IBUPROFEN 800 MG PO TABS: 800.0000 mg | ORAL_TABLET | Freq: Three times a day (TID) | ORAL | 5 refills | Status: DC | PRN

## 2022-07-17 NOTE — Progress Notes (Signed)
Pt presents for annual exam. Would like to discuss what she can do for period cramps (currently takes ibuprofen and midol). States her periods are irregular. Not on any contraception. Denies any abnormal discharge.  PHQ negative GAD negative Has a mammogram scheduled for November.

## 2022-07-17 NOTE — Progress Notes (Signed)
GYNECOLOGY ANNUAL PREVENTATIVE CARE ENCOUNTER NOTE  History:     Misty Flynn is a 51 y.o. [id number] female here for a routine annual gynecologic exam.  Current complaints: pain with menses.   Denies abnormal vaginal bleeding, discharge, pelvic pain, problems with intercourse or other gynecologic concerns.    She does note monthly menses, but the interval between cycles is becoming longer and more irregular.   Gynecologic History Patient's last menstrual period was 06/26/2022 (approximate). Contraception: none Last Pap: 04/23/21. Results were: normal with negative HPV Last mammogram: 12/07/21. Results were: normal Exam completed through [facility], next scheduled 2/24  Obstetric History OB History  Gravida Para Term Preterm AB Living  3 0 0   2 1  SAB IAB Ectopic Multiple Live Births    2     1    # Outcome Date GA Lbr Len/2nd Weight Sex Delivery Anes PTL Lv  3 IAB           2 Gravida      CS-LTranv     1 IAB             Past Medical History:  Diagnosis Date   History of chicken pox    HSV (herpes simplex virus) infection     Past Surgical History:  Procedure Laterality Date   CESAREAN SECTION     HERNIA REPAIR      Current Outpatient Medications on File Prior to Visit  Medication Sig Dispense Refill   valACYclovir (VALTREX) 500 MG tablet TAKE 1 TABLET (500 MG TOTAL) BY MOUTH 2 (TWO) TIMES DAILY. WITH SYMPTOMS OF OUTBREAK. 30 tablet 22   Cyanocobalamin (B-12 PO) Take 1 tablet by mouth daily. (Patient not taking: Reported on 07/17/2022)     No current facility-administered medications on file prior to visit.    No Known Allergies  Social History:  reports that she has never smoked. She has never used smokeless tobacco. She reports that she does not drink alcohol and does not use drugs.  Family History  Problem Relation Age of Onset   Diabetes Maternal Grandmother    Cancer Maternal Grandfather        prostate   Cancer - Prostate Father     The following portions  of the patient's history were reviewed and updated as appropriate: allergies, current medications, past family history, past medical history, past social history, past surgical history and problem list.  Review of Systems Pertinent items noted in HPI and remainder of comprehensive ROS otherwise negative.  Physical Exam:  BP 105/67   Pulse 80   Ht '5\' 2"'$  (1.575 m)   Wt 133 lb 9.6 oz (60.6 kg)   LMP 06/26/2022 (Approximate)   BMI 24.44 kg/m  CONSTITUTIONAL: Well-developed, well-nourished female in no acute distress.  HENT:  Normocephalic, atraumatic, External right and left ear normal. Oropharynx is clear and moist EYES: Conjunctivae and EOM are normal.  NECK: Normal range of motion, supple, no masses.  Normal thyroid.  SKIN: Skin is warm and dry. No rash noted. Not diaphoretic. No erythema. No pallor. MUSCULOSKELETAL: Normal range of motion. No tenderness.  No cyanosis, clubbing, or edema.  2+ distal pulses. NEUROLOGIC: Alert and oriented to person, place, and time. Normal reflexes, muscle tone coordination.  PSYCHIATRIC: Normal mood and affect. Normal behavior. Normal judgment and thought content. CARDIOVASCULAR: Normal heart rate noted, regular rhythm RESPIRATORY: Clear to auscultation bilaterally. Effort and breath sounds normal, no problems with respiration noted. BREASTS: deferred ABDOMEN: Soft, no distention noted.  No tenderness, rebound or guarding.  PELVIC: Normal appearing external genitalia and urethral meatus; normal appearing vaginal mucosa and cervix.  No abnormal discharge noted.  Pap smear obtained.  Normal uterine size, no other palpable masses, no uterine or adnexal tenderness.  No CMT. Performed in the presence of a chaperone.   Assessment and Plan:    1. Women's annual routine gynecological examination Normal annual exam Pap taken per pt request - Cytology - PAP  2. Generalized abdominal pain Pt is concerned regarding her ibuprofen and tylenol (Midol) use every  month during her menses.  She requested evaluation of her liver and kidneys. - Comprehensive metabolic panel  3. Dysmenorrhea Pt notes some mild moderate discomfort with each menses.  Pt uses ibuprofen and desires refill.   - ibuprofen (ADVIL) 800 MG tablet; Take 1 tablet (800 mg total) by mouth every 8 (eight) hours as needed for cramping.  Dispense: 30 tablet; Refill: 5  Will follow up results of pap smear and manage accordingly. [facility]  Routine preventative health maintenance measures emphasized. Please refer to After Visit Summary for other counseling recommendations.      Lynnda Shields, MD, [place] for [facility], [place]

## 2022-07-18 LAB — COMPREHENSIVE METABOLIC PANEL
ALT: 8 IU/L (ref 0–32)
AST: 17 IU/L (ref 0–40)
Albumin/Globulin Ratio: 2.2 (ref 1.2–2.2)
Albumin: 4.4 g/dL (ref 3.9–4.9)
Alkaline Phosphatase: 58 IU/L (ref 44–121)
BUN/Creatinine Ratio: 9 (ref 9–23)
BUN: 7 mg/dL (ref 6–24)
Bilirubin Total: 0.4 mg/dL (ref 0.0–1.2)
CO2: 20 mmol/L (ref 20–29)
Calcium: 9.1 mg/dL (ref 8.7–10.2)
Chloride: 103 mmol/L (ref 96–106)
Creatinine, Ser: 0.77 mg/dL (ref 0.57–1.00)
Globulin, Total: 2 g/dL (ref 1.5–4.5)
Glucose: 103 mg/dL — ABNORMAL HIGH (ref 70–99)
Potassium: 3.7 mmol/L (ref 3.5–5.2)
Sodium: 139 mmol/L (ref 134–144)
Total Protein: 6.4 g/dL (ref 6.0–8.5)
eGFR: 95 mL/min/{1.73_m2} (ref >59)

## 2022-07-19 LAB — CYTOLOGY - PAP
Chlamydia: NEGATIVE
Comment: NEGATIVE
Comment: NORMAL
Diagnosis: NEGATIVE
Neisseria Gonorrhea: NEGATIVE

## 2022-08-22 ENCOUNTER — Encounter: Payer: BC Managed Care – PPO | Admitting: Internal Medicine

## 2022-09-09 ENCOUNTER — Encounter: Payer: Self-pay | Admitting: Internal Medicine

## 2022-09-09 ENCOUNTER — Ambulatory Visit (INDEPENDENT_AMBULATORY_CARE_PROVIDER_SITE_OTHER): Payer: BC Managed Care – PPO | Admitting: Internal Medicine

## 2022-09-09 VITALS — BP 110/70 | HR 93 | Temp 98.3°F | Ht 62.5 in | Wt 134.3 lb

## 2022-09-09 DIAGNOSIS — Z23 Encounter for immunization: Secondary | ICD-10-CM | POA: Diagnosis not present

## 2022-09-09 DIAGNOSIS — Z0000 Encounter for general adult medical examination without abnormal findings: Secondary | ICD-10-CM

## 2022-09-09 DIAGNOSIS — Z1211 Encounter for screening for malignant neoplasm of colon: Secondary | ICD-10-CM | POA: Diagnosis not present

## 2022-09-09 LAB — IBC + FERRITIN
Ferritin: 14.7 ng/mL (ref 10.0–291.0)
Iron: 21 ug/dL — ABNORMAL LOW (ref 42–145)
Saturation Ratios: 6 % — ABNORMAL LOW (ref 20.0–50.0)
TIBC: 350 ug/dL (ref 250.0–450.0)
Transferrin: 250 mg/dL (ref 212.0–360.0)

## 2022-09-09 LAB — CBC WITH DIFFERENTIAL/PLATELET
Basophils Absolute: 0 10*3/uL (ref 0.0–0.1)
Basophils Relative: 0.8 % (ref 0.0–3.0)
Eosinophils Absolute: 0.1 10*3/uL (ref 0.0–0.7)
Eosinophils Relative: 2.2 % (ref 0.0–5.0)
HCT: 29.8 % — ABNORMAL LOW (ref 36.0–46.0)
Hemoglobin: 9.4 g/dL — ABNORMAL LOW (ref 12.0–15.0)
Lymphocytes Relative: 49 % — ABNORMAL HIGH (ref 12.0–46.0)
Lymphs Abs: 2.1 10*3/uL (ref 0.7–4.0)
MCHC: 31.6 g/dL (ref 30.0–36.0)
MCV: 74 fl — ABNORMAL LOW (ref 78.0–100.0)
Monocytes Absolute: 0.4 10*3/uL (ref 0.1–1.0)
Monocytes Relative: 8.3 % (ref 3.0–12.0)
Neutro Abs: 1.7 10*3/uL (ref 1.4–7.7)
Neutrophils Relative %: 39.7 % — ABNORMAL LOW (ref 43.0–77.0)
Platelets: 388 10*3/uL (ref 150.0–400.0)
RBC: 4.03 Mil/uL (ref 3.87–5.11)
RDW: 22.4 % — ABNORMAL HIGH (ref 11.5–15.5)
WBC: 4.2 10*3/uL (ref 4.0–10.5)

## 2022-09-09 LAB — LIPID PANEL
Cholesterol: 159 mg/dL (ref 0–200)
HDL: 52.9 mg/dL (ref >39.00)
LDL Cholesterol: 95 mg/dL (ref 0–99)
NonHDL: 106.17
Total CHOL/HDL Ratio: 3
Triglycerides: 58 mg/dL (ref 0.0–149.0)
VLDL: 11.6 mg/dL (ref 0.0–40.0)

## 2022-09-09 LAB — TSH: TSH: 0.73 u[IU]/mL (ref 0.35–5.50)

## 2022-09-09 LAB — COMPREHENSIVE METABOLIC PANEL
ALT: 9 U/L (ref 0–35)
AST: 16 U/L (ref 0–37)
Albumin: 4.1 g/dL (ref 3.5–5.2)
Alkaline Phosphatase: 42 U/L (ref 39–117)
BUN: 10 mg/dL (ref 6–23)
CO2: 27 mEq/L (ref 19–32)
Calcium: 8.9 mg/dL (ref 8.4–10.5)
Chloride: 105 mEq/L (ref 96–112)
Creatinine, Ser: 0.78 mg/dL (ref 0.40–1.20)
GFR: 89.52 mL/min (ref >60.00)
Glucose, Bld: 97 mg/dL (ref 70–99)
Potassium: 3.9 mEq/L (ref 3.5–5.1)
Sodium: 137 mEq/L (ref 135–145)
Total Bilirubin: 0.3 mg/dL (ref 0.2–1.2)
Total Protein: 6.5 g/dL (ref 6.0–8.3)

## 2022-09-09 LAB — VITAMIN B12: Vitamin B-12: 195 pg/mL — ABNORMAL LOW (ref 211–911)

## 2022-09-09 LAB — VITAMIN D 25 HYDROXY (VIT D DEFICIENCY, FRACTURES): VITD: 20.74 ng/mL — ABNORMAL LOW (ref 30.00–100.00)

## 2022-09-09 NOTE — Addendum Note (Signed)
Addended by: Westley Hummer B on: 09/09/2022 08:39 AM   Modules accepted: Orders

## 2022-09-09 NOTE — Progress Notes (Signed)
Established Patient Office Visit     CC/Reason for Visit: Annual preventive exam  HPI: Misty Flynn is a 51 y.o. female who is coming in today for the above mentioned reasons. Past Medical History is significant for: Iron deficiency anemia and genital herpes.  Last month she had an episode of nausea and dizziness associated with low blood pressure.  No recent GI or respiratory illness, however she does think she might have been a tad dehydrated.  She is overdue for colonoscopy, she is overdue for flu, COVID, Tdap vaccines.   Past Medical/Surgical History: Past Medical History:  Diagnosis Date   History of chicken pox    HSV (herpes simplex virus) infection     Past Surgical History:  Procedure Laterality Date   CESAREAN SECTION     HERNIA REPAIR      Social History:  reports that she has never smoked. She has never used smokeless tobacco. She reports that she does not drink alcohol and does not use drugs.  Allergies: No Known Allergies  Family History:  Family History  Problem Relation Age of Onset   Diabetes Maternal Grandmother    Cancer Maternal Grandfather        prostate   Cancer - Prostate Father      Current Outpatient Medications:    ibuprofen (ADVIL) 800 MG tablet, Take 1 tablet (800 mg total) by mouth every 8 (eight) hours as needed for cramping., Disp: 30 tablet, Rfl: 5   valACYclovir (VALTREX) 500 MG tablet, TAKE 1 TABLET (500 MG TOTAL) BY MOUTH 2 (TWO) TIMES DAILY. WITH SYMPTOMS OF OUTBREAK., Disp: 30 tablet, Rfl: 22  Review of Systems:  Constitutional: Denies fever, chills, diaphoresis, appetite change and fatigue.  HEENT: Denies photophobia, eye pain, redness, hearing loss, ear pain, congestion, sore throat, rhinorrhea, sneezing, mouth sores, trouble swallowing, neck pain, neck stiffness and tinnitus.   Respiratory: Denies SOB, DOE, cough, chest tightness,  and wheezing.   Cardiovascular: Denies chest pain, palpitations and leg swelling.   Gastrointestinal: Denies nausea, vomiting, abdominal pain, diarrhea, constipation, blood in stool and abdominal distention.  Genitourinary: Denies dysuria, urgency, frequency, hematuria, flank pain and difficulty urinating.  Endocrine: Denies: hot or cold intolerance, sweats, changes in hair or nails, polyuria, polydipsia. Musculoskeletal: Denies myalgias, back pain, joint swelling, arthralgias and gait problem.  Skin: Denies pallor, rash and wound.  Neurological: Denies dizziness, seizures, syncope, weakness, light-headedness, numbness and headaches.  Hematological: Denies adenopathy. Easy bruising, personal or family bleeding history  Psychiatric/Behavioral: Denies suicidal ideation, mood changes, confusion, nervousness, sleep disturbance and agitation    Physical Exam: Vitals:   09/09/22 0756  BP: 110/70  Pulse: 93  Temp: 98.3 F (36.8 C)  TempSrc: Oral  SpO2: 98%  Weight: 134 lb 4.8 oz (60.9 kg)  Height: 5' 2.5" (1.588 m)    Body mass index is 24.17 kg/m.   Constitutional: NAD, calm, comfortable Eyes: PERRL, lids and conjunctivae normal, wears corrective lenses ENMT: Mucous membranes are moist. Posterior pharynx clear of any exudate or lesions. Normal dentition. Tympanic membrane is pearly white, no erythema or bulging. Neck: normal, supple, no masses, no thyromegaly Respiratory: clear to auscultation bilaterally, no wheezing, no crackles. Normal respiratory effort. No accessory muscle use.  Cardiovascular: Regular rate and rhythm, no murmurs / rubs / gallops. No extremity edema. 2+ pedal pulses. No carotid bruits.  Abdomen: no tenderness, no masses palpated. No hepatosplenomegaly. Bowel sounds positive.  Musculoskeletal: no clubbing / cyanosis. No joint deformity upper and lower extremities. Good  ROM, no contractures. Normal muscle tone.  Skin: no rashes, lesions, ulcers. No induration Neurologic: CN 2-12 grossly intact. Sensation intact, DTR normal. Strength 5/5 in all 4.   Psychiatric: Normal judgment and insight. Alert and oriented x 3. Normal mood.   [place] Office Visit from 09/09/2022 in [place] [facility] [place]  PHQ-9 Total Score 3         Impression and Plan:  Encounter for preventive health examination - Plan: CBC with Differential/Platelet, Comprehensive metabolic panel, Lipid panel, TSH, Vitamin B12, VITAMIN D 25 Hydroxy (Vit-D Deficiency, Fractures), IBC + Ferritin  Need for diphtheria-tetanus-pertussis (Tdap) vaccine - Plan: Tdap vaccine greater than or equal to 7yo IM   -Recommend routine eye and dental care. -Immunizations: Tdap in office today, advised updating flu and COVID boosters today but she declines despite counseling -Healthy lifestyle discussed in detail. -Labs to be updated today. -Colon cancer screening: Overdue, referral placed -Breast cancer screening: 11/2021 -Cervical cancer screening: 06/2022 -Lung cancer screening: Not applicable -Prostate cancer screening: Not applicable -DEXA: Not applicable  -In regards to her recent orthostatic hypotension, we have discussed increasing fluid intake.  Check labs today including iron levels.    Lelon Frohlich, MD  Primary Care at [facility]

## 2022-09-10 ENCOUNTER — Other Ambulatory Visit: Payer: Self-pay | Admitting: Internal Medicine

## 2022-09-10 ENCOUNTER — Encounter: Payer: Self-pay | Admitting: Internal Medicine

## 2022-09-10 ENCOUNTER — Telehealth: Payer: Self-pay | Admitting: Internal Medicine

## 2022-09-10 DIAGNOSIS — E559 Vitamin D deficiency, unspecified: Secondary | ICD-10-CM | POA: Insufficient documentation

## 2022-09-10 DIAGNOSIS — D509 Iron deficiency anemia, unspecified: Secondary | ICD-10-CM

## 2022-09-10 DIAGNOSIS — E538 Deficiency of other specified B group vitamins: Secondary | ICD-10-CM | POA: Insufficient documentation

## 2022-09-10 MED ORDER — VITAMIN D (ERGOCALCIFEROL) 1.25 MG (50000 UNIT) PO CAPS: 50000.0000 [IU] | ORAL_CAPSULE | ORAL | 0 refills | Status: AC

## 2022-09-10 MED ORDER — IRON (FERROUS SULFATE) 325 (65 FE) MG PO TABS: 325.0000 mg | ORAL_TABLET | Freq: Two times a day (BID) | ORAL | 1 refills | Status: DC

## 2022-09-10 NOTE — Progress Notes (Signed)
1. She remains anemic and  iron deficient. Would advise ferrous sulfate 325 mg twice daily and repeat CBC in 3 months. NEEDS to have her c-scope done to rule out GI blood loss as a source.  2. Vit D def: 50000 IU weekly x 12 weeks with follow up levels then. Will send Rx.  3. Vit B12 deficiency: IM weekly x 4 then monthly. In office or send supplies?  All other labs look ok.

## 2022-09-10 NOTE — Telephone Encounter (Signed)
Requesting a call to discuss lab results

## 2022-09-11 ENCOUNTER — Other Ambulatory Visit: Payer: Self-pay | Admitting: *Deleted

## 2022-09-11 DIAGNOSIS — D509 Iron deficiency anemia, unspecified: Secondary | ICD-10-CM

## 2022-09-11 DIAGNOSIS — E559 Vitamin D deficiency, unspecified: Secondary | ICD-10-CM

## 2022-09-17 ENCOUNTER — Ambulatory Visit (INDEPENDENT_AMBULATORY_CARE_PROVIDER_SITE_OTHER): Payer: BC Managed Care – PPO

## 2022-09-17 DIAGNOSIS — E538 Deficiency of other specified B group vitamins: Secondary | ICD-10-CM

## 2022-09-17 MED ORDER — CYANOCOBALAMIN 1000 MCG/ML IJ SOLN
1000.0000 ug | INTRAMUSCULAR | Status: AC
  Administered 2022-09-17: 1000 ug via INTRAMUSCULAR

## 2022-09-17 NOTE — Progress Notes (Signed)
Pt here for weekly B12 injection #1 of 4 per Dr Jerilee Hoh.  B12 1033mg given IM left deltoid and pt tolerated injection well.  Pt to schedule next B12 upon check out.

## 2022-09-24 ENCOUNTER — Ambulatory Visit (INDEPENDENT_AMBULATORY_CARE_PROVIDER_SITE_OTHER): Payer: BC Managed Care – PPO | Admitting: *Deleted

## 2022-09-24 DIAGNOSIS — E538 Deficiency of other specified B group vitamins: Secondary | ICD-10-CM | POA: Diagnosis not present

## 2022-09-24 MED ORDER — CYANOCOBALAMIN 1000 MCG/ML IJ SOLN
1000.0000 ug | Freq: Once | INTRAMUSCULAR | Status: AC
  Administered 2022-09-24: 1000 ug via INTRAMUSCULAR

## 2022-09-24 NOTE — Progress Notes (Signed)
Per orders of Dr. Hernandez, injection of B12 given by Kristelle Cavallaro. Patient tolerated injection well.  

## 2022-09-30 ENCOUNTER — Ambulatory Visit (INDEPENDENT_AMBULATORY_CARE_PROVIDER_SITE_OTHER): Payer: BC Managed Care – PPO | Admitting: *Deleted

## 2022-09-30 DIAGNOSIS — E538 Deficiency of other specified B group vitamins: Secondary | ICD-10-CM

## 2022-09-30 MED ORDER — CYANOCOBALAMIN 1000 MCG/ML IJ SOLN
1000.0000 ug | Freq: Once | INTRAMUSCULAR | Status: AC
  Administered 2022-09-30: 1000 ug via INTRAMUSCULAR

## 2022-09-30 NOTE — Progress Notes (Signed)
Per orders of Dr. Burchette, injection of B12 given by Chenise Mulvihill. Patient tolerated injection well. 

## 2022-10-16 ENCOUNTER — Ambulatory Visit (INDEPENDENT_AMBULATORY_CARE_PROVIDER_SITE_OTHER): Payer: BC Managed Care – PPO | Admitting: *Deleted

## 2022-10-16 DIAGNOSIS — E538 Deficiency of other specified B group vitamins: Secondary | ICD-10-CM | POA: Diagnosis not present

## 2022-10-16 MED ORDER — CYANOCOBALAMIN 1000 MCG/ML IJ SOLN
1000.0000 ug | Freq: Once | INTRAMUSCULAR | Status: AC
  Administered 2022-10-16: 1000 ug via INTRAMUSCULAR

## 2022-10-16 NOTE — Patient Instructions (Signed)
Per orders of Dr. Hernandez, injection of B12 given by Anna Livers. Patient tolerated injection well.  

## 2022-10-22 NOTE — Addendum Note (Signed)
Addended by: Westley Hummer B on: 10/22/2022 07:13 AM   Modules accepted: Orders

## 2022-10-29 NOTE — Progress Notes (Signed)
Per orders of Dr. Jerilee Hoh, injection of b12 given by Westley Hummer. Patient tolerated injection well.

## 2022-11-18 ENCOUNTER — Ambulatory Visit (INDEPENDENT_AMBULATORY_CARE_PROVIDER_SITE_OTHER): Payer: No Typology Code available for payment source | Admitting: *Deleted

## 2022-11-18 DIAGNOSIS — E538 Deficiency of other specified B group vitamins: Secondary | ICD-10-CM | POA: Diagnosis not present

## 2022-11-18 MED ORDER — CYANOCOBALAMIN 1000 MCG/ML IJ SOLN
1000.0000 ug | Freq: Once | INTRAMUSCULAR | Status: AC
  Administered 2022-11-18: 1000 ug via INTRAMUSCULAR

## 2022-11-18 NOTE — Progress Notes (Signed)
Per orders of Dr. Hernandez, injection of B12 given by Aideliz Garmany. Patient tolerated injection well.  

## 2022-11-27 ENCOUNTER — Other Ambulatory Visit: Payer: Self-pay | Admitting: Internal Medicine

## 2022-11-27 DIAGNOSIS — E559 Vitamin D deficiency, unspecified: Secondary | ICD-10-CM

## 2022-12-17 ENCOUNTER — Other Ambulatory Visit (INDEPENDENT_AMBULATORY_CARE_PROVIDER_SITE_OTHER): Payer: No Typology Code available for payment source

## 2022-12-17 ENCOUNTER — Ambulatory Visit: Payer: No Typology Code available for payment source | Admitting: *Deleted

## 2022-12-17 ENCOUNTER — Other Ambulatory Visit: Payer: Self-pay | Admitting: Internal Medicine

## 2022-12-17 DIAGNOSIS — D509 Iron deficiency anemia, unspecified: Secondary | ICD-10-CM

## 2022-12-17 DIAGNOSIS — E559 Vitamin D deficiency, unspecified: Secondary | ICD-10-CM

## 2022-12-17 DIAGNOSIS — E538 Deficiency of other specified B group vitamins: Secondary | ICD-10-CM

## 2022-12-17 DIAGNOSIS — Z1211 Encounter for screening for malignant neoplasm of colon: Secondary | ICD-10-CM

## 2022-12-17 LAB — CBC WITH DIFFERENTIAL/PLATELET
Basophils Absolute: 0 10*3/uL (ref 0.0–0.1)
Basophils Relative: 0.8 % (ref 0.0–3.0)
Eosinophils Absolute: 0 10*3/uL (ref 0.0–0.7)
Eosinophils Relative: 1.2 % (ref 0.0–5.0)
HCT: 35.7 % — ABNORMAL LOW (ref 36.0–46.0)
Hemoglobin: 11.6 g/dL — ABNORMAL LOW (ref 12.0–15.0)
Lymphocytes Relative: 51.6 % — ABNORMAL HIGH (ref 12.0–46.0)
Lymphs Abs: 2 10*3/uL (ref 0.7–4.0)
MCHC: 32.4 g/dL (ref 30.0–36.0)
MCV: 84.2 fl (ref 78.0–100.0)
Monocytes Absolute: 0.2 10*3/uL (ref 0.1–1.0)
Monocytes Relative: 6 % (ref 3.0–12.0)
Neutro Abs: 1.5 10*3/uL (ref 1.4–7.7)
Neutrophils Relative %: 40.4 % — ABNORMAL LOW (ref 43.0–77.0)
Platelets: 310 10*3/uL (ref 150.0–400.0)
RBC: 4.23 Mil/uL (ref 3.87–5.11)
RDW: 16.7 % — ABNORMAL HIGH (ref 11.5–15.5)
WBC: 3.8 10*3/uL — ABNORMAL LOW (ref 4.0–10.5)

## 2022-12-17 LAB — VITAMIN D 25 HYDROXY (VIT D DEFICIENCY, FRACTURES): VITD: 31.14 ng/mL (ref 30.00–100.00)

## 2022-12-17 LAB — VITAMIN B12: Vitamin B-12: >1500 pg/mL — ABNORMAL HIGH (ref 211–911)

## 2022-12-17 MED ORDER — CYANOCOBALAMIN 1000 MCG/ML IJ SOLN
1000.0000 ug | Freq: Once | INTRAMUSCULAR | Status: AC
  Administered 2022-12-17: 1000 ug via INTRAMUSCULAR

## 2022-12-17 MED ORDER — VITAMIN D (ERGOCALCIFEROL) 1.25 MG (50000 UNIT) PO CAPS: 50000.0000 [IU] | ORAL_CAPSULE | ORAL | 0 refills | Status: DC

## 2022-12-17 NOTE — Progress Notes (Signed)
Per orders of Dr. Hernandez, injection of B12 given by Jandy Brackens. Patient tolerated injection well.  

## 2022-12-24 ENCOUNTER — Other Ambulatory Visit: Payer: Self-pay | Admitting: *Deleted

## 2022-12-24 DIAGNOSIS — E559 Vitamin D deficiency, unspecified: Secondary | ICD-10-CM

## 2022-12-24 MED ORDER — VITAMIN D (ERGOCALCIFEROL) 1.25 MG (50000 UNIT) PO CAPS: 50000.0000 [IU] | ORAL_CAPSULE | ORAL | 0 refills | Status: DC

## 2022-12-24 MED ORDER — VITAMIN D (ERGOCALCIFEROL) 1.25 MG (50000 UNIT) PO CAPS: 50000.0000 [IU] | ORAL_CAPSULE | ORAL | 0 refills | Status: AC

## 2023-01-14 ENCOUNTER — Telehealth: Payer: Self-pay | Admitting: Internal Medicine

## 2023-01-14 NOTE — Telephone Encounter (Signed)
Pt called to say she received the Rx for the Vitamin D, but she was under the impression MD would also send an Rx for B-12.  Pt states she used to come in for the shots, but thought she would start taking in pill formation.   Please advise.

## 2023-01-15 NOTE — Telephone Encounter (Signed)
Patient is aware.  Medication list is updated.

## 2023-03-31 ENCOUNTER — Telehealth: Payer: Self-pay | Admitting: Internal Medicine

## 2023-03-31 NOTE — Telephone Encounter (Signed)
Pt requesting another prescription for Vitamin D, Ergocalciferol, (DRISDOL) 1.25 MG (50000 UNIT) CAPS capsule  and requests a call when done

## 2023-03-31 NOTE — Telephone Encounter (Signed)
Lab appointment scheduled 

## 2023-04-17 ENCOUNTER — Other Ambulatory Visit: Payer: No Typology Code available for payment source

## 2023-04-23 ENCOUNTER — Other Ambulatory Visit: Payer: No Typology Code available for payment source

## 2023-04-29 ENCOUNTER — Other Ambulatory Visit (INDEPENDENT_AMBULATORY_CARE_PROVIDER_SITE_OTHER): Payer: No Typology Code available for payment source

## 2023-04-29 DIAGNOSIS — E559 Vitamin D deficiency, unspecified: Secondary | ICD-10-CM | POA: Diagnosis not present

## 2023-04-29 LAB — VITAMIN D 25 HYDROXY (VIT D DEFICIENCY, FRACTURES): VITD: 32.97 ng/mL (ref 30.00–100.00)

## 2023-05-06 ENCOUNTER — Other Ambulatory Visit: Payer: Self-pay | Admitting: Internal Medicine

## 2023-05-06 DIAGNOSIS — E559 Vitamin D deficiency, unspecified: Secondary | ICD-10-CM

## 2023-05-06 MED ORDER — VITAMIN D (ERGOCALCIFEROL) 1.25 MG (50000 UNIT) PO CAPS: 50000.0000 [IU] | ORAL_CAPSULE | ORAL | 0 refills | Status: AC

## 2023-05-21 ENCOUNTER — Telehealth: Payer: Self-pay

## 2023-05-21 NOTE — Telephone Encounter (Signed)
Pt states she is clotting with heavy bleeding and pain. Pt requesting appt.

## 2023-05-23 ENCOUNTER — Encounter: Payer: Self-pay | Admitting: Gastroenterology

## 2023-05-27 ENCOUNTER — Ambulatory Visit: Payer: No Typology Code available for payment source | Admitting: Obstetrics and Gynecology

## 2023-05-27 ENCOUNTER — Encounter: Payer: Self-pay | Admitting: Obstetrics and Gynecology

## 2023-05-27 VITALS — BP 110/71 | HR 70 | Ht 63.0 in | Wt 135.5 lb

## 2023-05-27 DIAGNOSIS — N920 Excessive and frequent menstruation with regular cycle: Secondary | ICD-10-CM

## 2023-05-27 MED ORDER — TRANEXAMIC ACID 650 MG PO TABS: 1300.0000 mg | ORAL_TABLET | Freq: Three times a day (TID) | ORAL | 2 refills | Status: AC

## 2023-05-27 NOTE — Progress Notes (Signed)
  CC: menstrual bleeding issue Subjective:    Patient ID: Misty Flynn, female    DOB: 1970-12-07, 52 y.o.   MRN: [id number]  HPI 52 yo G3P1, c/s x 1 seen for discussion of menstrual issue.  She notes 1 year of increased cramping and passing clots.  She has fairly regular menses lasting 5 dasy.  Menses are heavy the first 3-4 days of the cycle.  Pt has tries OCPs and ibuprofen in the past.  OCPs worked initially but then less so; patient discontinued the OCPs.  No recent imaging noted.   Review of Systems     Objective:   Physical Exam Vitals:   05/27/23 1559  BP: 110/71  Pulse: 70         Assessment & Plan:   1. Menorrhagia with regular cycle Will check pelvic ultrasound to see if there are any anatomical issues.   Trial of lysteda for control of menses until ultrasound is completed.  - US PELVIC COMPLETE WITH TRANSVAGINAL; Future - tranexamic acid (LYSTEDA) 650 MG TABS tablet; Take 2 tablets (1,300 mg total) by mouth 3 (three) times daily. Take during menses for a maximum of five days  Dispense: 30 tablet; Refill: 2  F/u in 1 month.  I spent 20 minutes dedicated to the care of this patient including previsit review of records, face to face time with the patient discussing treatment options, evaluation plan and post visit testing.   Warden Fillers, MD Faculty Attending, Center for [facility]

## 2023-06-04 ENCOUNTER — Ambulatory Visit (HOSPITAL_COMMUNITY): Payer: No Typology Code available for payment source

## 2023-06-10 ENCOUNTER — Ambulatory Visit (HOSPITAL_COMMUNITY)
Admission: RE | Admit: 2023-06-10 | Discharge: 2023-06-10 | Disposition: A | Discharge status: Home / Self Care | Payer: No Typology Code available for payment source | Source: Ambulatory Visit | Attending: Obstetrics and Gynecology | Admitting: Obstetrics and Gynecology

## 2023-06-10 DIAGNOSIS — N920 Excessive and frequent menstruation with regular cycle: Secondary | ICD-10-CM | POA: Diagnosis present

## 2023-07-14 ENCOUNTER — Telehealth: Payer: No Typology Code available for payment source | Admitting: Physician Assistant

## 2023-07-14 NOTE — Progress Notes (Signed)
The patient no-showed for appointment despite this provider sending direct link, reaching out via phone with no response and waiting for at least 10 minutes from appointment time for patient to join. They will be marked as a NS for this appointment/time.   Margaretann Loveless, PA-C

## 2023-07-15 ENCOUNTER — Ambulatory Visit (AMBULATORY_SURGERY_CENTER): Payer: No Typology Code available for payment source

## 2023-07-15 VITALS — Ht 63.0 in | Wt 138.0 lb

## 2023-07-15 DIAGNOSIS — Z1211 Encounter for screening for malignant neoplasm of colon: Secondary | ICD-10-CM

## 2023-07-15 MED ORDER — NA SULFATE-K SULFATE-MG SULF 17.5-3.13-1.6 GM/177ML PO SOLN: 1.0000 | Freq: Once | ORAL | 0 refills | Status: AC

## 2023-07-15 NOTE — Progress Notes (Signed)
No egg or soy allergy known to patient  No issues known to pt with past sedation with any surgeries or procedures Patient denies ever being told they had issues or difficulty with intubation  No FH of Malignant Hyperthermia Pt is not on diet pills Pt is not on  home 02  Pt is not on blood thinners  Pt denies issues with constipation  No A fib or A flutter Have any cardiac testing pending--no LOA: independent  Prep: suprep  Patient's chart reviewed by Cathlyn Parsons CNRA prior to previsit and patient appropriate for the [facility].  Previsit completed and red dot placed by patient's name on their procedure day (on provider's schedule).     PV competed with patient. Prep instructions sent via mychart and home address. Goodrx coupon for [facility] provided to use for price reduction if needed.

## 2023-07-28 ENCOUNTER — Other Ambulatory Visit (HOSPITAL_COMMUNITY)
Admission: RE | Admit: 2023-07-28 | Discharge: 2023-07-28 | Disposition: A | Discharge status: Home / Self Care | Payer: No Typology Code available for payment source | Source: Ambulatory Visit | Attending: Obstetrics and Gynecology | Admitting: Obstetrics and Gynecology

## 2023-07-28 ENCOUNTER — Ambulatory Visit (INDEPENDENT_AMBULATORY_CARE_PROVIDER_SITE_OTHER): Payer: No Typology Code available for payment source | Admitting: Obstetrics and Gynecology

## 2023-07-28 VITALS — BP 128/77 | HR 74 | Wt 132.6 lb

## 2023-07-28 DIAGNOSIS — D259 Leiomyoma of uterus, unspecified: Secondary | ICD-10-CM | POA: Insufficient documentation

## 2023-07-28 DIAGNOSIS — D251 Intramural leiomyoma of uterus: Secondary | ICD-10-CM | POA: Diagnosis not present

## 2023-07-28 DIAGNOSIS — N920 Excessive and frequent menstruation with regular cycle: Secondary | ICD-10-CM | POA: Diagnosis not present

## 2023-07-28 DIAGNOSIS — Z3202 Encounter for pregnancy test, result negative: Secondary | ICD-10-CM | POA: Diagnosis not present

## 2023-07-28 DIAGNOSIS — Z01419 Encounter for gynecological examination (general) (routine) without abnormal findings: Secondary | ICD-10-CM | POA: Insufficient documentation

## 2023-07-28 LAB — POCT URINE PREGNANCY: Preg Test, Ur: NEGATIVE

## 2023-07-28 NOTE — Progress Notes (Signed)
Pt. Presents for Annual. Pt. Is having heavy menstrual periods and is requesting an IUD.   UPT was Negative.

## 2023-07-28 NOTE — Progress Notes (Signed)
GYNECOLOGY ANNUAL PREVENTATIVE CARE ENCOUNTER NOTE  History:     Misty Flynn is a 52 y.o. G52P0021 female here for a routine annual gynecologic exam.  Current complaints: continued cramping with menses.Had previously discussed lysteda, but pt did not try the medication.  Gynecologic History Patient's last menstrual period was 07/19/2023 (exact date). Contraception: none Last Pap: 07/17/22. Results were: normal with negative HPV Last mammogram: 07/17/22. Results were: normal  Obstetric History OB History  Gravida Para Term Preterm AB Living  3 0 0   2 1  SAB IAB Ectopic Multiple Live Births    2     1    # Outcome Date GA Lbr Len/2nd Weight Sex Type Anes PTL Lv  3 IAB           2 Gravida      CS-LTranv     1 IAB             Past Medical History:  Diagnosis Date   History of chicken pox    HSV (herpes simplex virus) infection     Past Surgical History:  Procedure Laterality Date   CESAREAN SECTION     HERNIA REPAIR      Current Outpatient Medications on File Prior to Visit  Medication Sig Dispense Refill   cyanocobalamin (VITAMIN B12) 1000 MCG tablet Take 1,000 mcg by mouth daily.     ibuprofen (ADVIL) 800 MG tablet Take 1 tablet (800 mg total) by mouth every 8 (eight) hours as needed for cramping. 30 tablet 5   Iron, Ferrous Sulfate, 325 (65 Fe) MG TABS Take 325 mg by mouth 2 (two) times daily. 180 tablet 1   tranexamic acid (LYSTEDA) 650 MG TABS tablet Take 2 tablets (1,300 mg total) by mouth 3 (three) times daily. Take during menses for a maximum of five days 30 tablet 2   valACYclovir (VALTREX) 500 MG tablet TAKE 1 TABLET (500 MG TOTAL) BY MOUTH 2 (TWO) TIMES DAILY. WITH SYMPTOMS OF OUTBREAK. 30 tablet 22   No current facility-administered medications on file prior to visit.    No Known Allergies  Social History:  reports that she has never smoked. She has never used smokeless tobacco. She reports that she does not drink alcohol and does not use drugs.  Family  History  Problem Relation Age of Onset   Cancer - Prostate Father    Diabetes Maternal Grandmother    Cancer Maternal Grandfather        prostate   Colon cancer Neg Hx    Colon polyps Neg Hx    Esophageal cancer Neg Hx    Rectal cancer Neg Hx    Stomach cancer Neg Hx     The following portions of the patient's history were reviewed and updated as appropriate: allergies, current medications, past family history, past medical history, past social history, past surgical history and problem list.  Review of Systems Pertinent items noted in HPI and remainder of comprehensive ROS otherwise negative.  Physical Exam:  BP 128/77   Pulse 74   Wt 132 lb 9.6 oz (60.1 kg)   LMP 07/19/2023 (Exact Date)   BMI 23.49 kg/m  CONSTITUTIONAL: Well-developed, well-nourished female in no acute distress.  HENT:  Normocephalic, atraumatic, External right and left ear normal. Oropharynx is clear and moist EYES: Conjunctivae and EOM are normal. Pupils are equal, round, and reactive to light. No scleral icterus.  NECK: Normal range of motion, supple, no masses.  Normal thyroid.  SKIN: Skin  is warm and dry. No rash noted. Not diaphoretic. No erythema. No pallor. MUSCULOSKELETAL: Normal range of motion. No tenderness.  No cyanosis, clubbing, or edema.  2+ distal pulses. NEUROLOGIC: Alert and oriented to person, place, and time. Normal reflexes, muscle tone coordination.  PSYCHIATRIC: Normal mood and affect. Normal behavior. Normal judgment and thought content. CARDIOVASCULAR: Normal heart rate noted, regular rhythm RESPIRATORY: Clear to auscultation bilaterally. Effort and breath sounds normal, no problems with respiration noted. BREASTS: Symmetric in size. No masses, tenderness, skin changes, nipple drainage, or lymphadenopathy bilaterally. Performed in the presence of a chaperone. ABDOMEN: Soft, no distention noted.  No tenderness, rebound or guarding.  PELVIC: Normal appearing external genitalia and  urethral meatus; normal appearing vaginal mucosa and cervix.  No abnormal discharge noted.  Pap smear obtained.  Anterior, tight cervix.  Questionable stenotic.  Normal uterine size, with probable posterior fibroid palpated, no uterine or adnexal tenderness.  Performed in the presence of a chaperone.   Assessment and Plan:    1. Women's annual routine gynecological examination Normal annual exam Pt will get mamm[facility] through [facility] - POCT urine pregnancy  2. Menorrhagia with regular cycle Pt was interested in progesterone IUD, but with 4.5 cm fibroid, it may have been ineffective with decreasing bleeding/cramping.  Discussed alternative treatments. UFE Sonata Myfembree Hysterectomy Due to tight almost stenotic cervix, UFE may be the better option for treatment.  If UFE is unable to be utilized or is ineffective, would need to consider hysterectomy at that time.  PT will try lysteda as she waits for evaluation with interventional radiology  - Ambulatory referral to Interventional Radiology  3. Intramural leiomyoma of uterus See above.  Will follow up results of pap smear and manage accordingly. Mammogram scheduled Routine preventative health maintenance measures emphasized. Please refer to After Visit Summary for other counseling recommendations.      Mariel Aloe, MD, FACOG Obstetrician & Gynecologist, [facility] for [facility], [facility]

## 2023-07-31 ENCOUNTER — Encounter: Payer: Self-pay | Admitting: Gastroenterology

## 2023-07-31 LAB — CYTOLOGY - PAP
Adequacy: ABSENT
Comment: NEGATIVE
Diagnosis: NEGATIVE
High risk HPV: NEGATIVE

## 2023-08-05 ENCOUNTER — Encounter: Payer: Self-pay | Admitting: Gastroenterology

## 2023-08-12 ENCOUNTER — Encounter: Payer: No Typology Code available for payment source | Admitting: Gastroenterology

## 2023-10-27 ENCOUNTER — Ambulatory Visit (AMBULATORY_SURGERY_CENTER): Payer: No Typology Code available for payment source

## 2023-10-27 VITALS — Ht 65.0 in | Wt 138.0 lb

## 2023-10-27 DIAGNOSIS — Z1211 Encounter for screening for malignant neoplasm of colon: Secondary | ICD-10-CM

## 2023-10-27 MED ORDER — NA SULFATE-K SULFATE-MG SULF 17.5-3.13-1.6 GM/177ML PO SOLN
1.0000 | Freq: Once | ORAL | 0 refills | Status: AC
Start: 2023-10-27 — End: 2023-10-27

## 2023-10-27 MED ORDER — ONDANSETRON HCL 4 MG PO TABS
4.0000 mg | ORAL_TABLET | ORAL | 0 refills | Status: AC
Start: 2023-10-27 — End: ?

## 2023-10-27 NOTE — Progress Notes (Signed)

## 2023-11-12 ENCOUNTER — Ambulatory Visit (INDEPENDENT_AMBULATORY_CARE_PROVIDER_SITE_OTHER): Payer: No Typology Code available for payment source | Admitting: Obstetrics and Gynecology

## 2023-11-12 ENCOUNTER — Other Ambulatory Visit: Payer: Self-pay | Admitting: Internal Medicine

## 2023-11-12 ENCOUNTER — Other Ambulatory Visit: Payer: Self-pay | Admitting: Obstetrics and Gynecology

## 2023-11-12 ENCOUNTER — Encounter: Payer: Self-pay | Admitting: Obstetrics and Gynecology

## 2023-11-12 ENCOUNTER — Encounter: Payer: No Typology Code available for payment source | Admitting: Gastroenterology

## 2023-11-12 VITALS — BP 109/74 | HR 100 | Ht 63.0 in | Wt 137.4 lb

## 2023-11-12 DIAGNOSIS — D251 Intramural leiomyoma of uterus: Secondary | ICD-10-CM

## 2023-11-12 DIAGNOSIS — N946 Dysmenorrhea, unspecified: Secondary | ICD-10-CM | POA: Diagnosis not present

## 2023-11-12 DIAGNOSIS — N920 Excessive and frequent menstruation with regular cycle: Secondary | ICD-10-CM | POA: Diagnosis not present

## 2023-11-12 DIAGNOSIS — E559 Vitamin D deficiency, unspecified: Secondary | ICD-10-CM

## 2023-11-12 DIAGNOSIS — D500 Iron deficiency anemia secondary to blood loss (chronic): Secondary | ICD-10-CM | POA: Diagnosis not present

## 2023-11-12 MED ORDER — MEGESTROL ACETATE 40 MG PO TABS: 40.0000 mg | ORAL_TABLET | Freq: Two times a day (BID) | ORAL | 5 refills | Status: AC

## 2023-11-12 NOTE — Progress Notes (Signed)
  CC: heavy bleeding Subjective:    Patient ID: Misty Flynn, female    DOB: Aug 20, 1971, 53 y.o.   MRN: [id number]  HPI 53 yo G3P0021 seen for follow up of vaginal bleeding.  Lysteda  has proven ineffective.  Pt continues to have heavy vaginal bleeding.  Interventional radiology referral has not been set up, and we are trying to reschedule the visit.   Review of Systems     Objective:   Physical Exam Vitals:   11/12/23 1545  BP: 109/74  Pulse: 100         Assessment & Plan:   1. Dysmenorrhea (Primary)   2. Menorrhagia with regular cycle Will check cbc to eval hgb level.  Discontinue lysteda .  Start megace  BID for control of bleeding until seen by interventional radiology or she has hysterectomy.  Reschedule IR referral ASAP. - CBC - megestrol  (MEGACE ) 40 MG tablet; Take 1 tablet (40 mg total) by mouth 2 (two) times daily. Can increase to two tablets twice a day in the event of heavy bleeding  Dispense: 60 tablet; Refill: 5  3. Iron  deficiency anemia due to chronic blood loss  - CBC  4. Intramural leiomyoma of uterus Pt has history of previous c section.  Any potential hysterectomy approach will need to take that into account.    Abigail Abler, MD Faculty Attending, Center for [facility]

## 2023-11-12 NOTE — Progress Notes (Signed)
 C/O pelvic pain X 3 days. Vaginal bleeding started 10/20/2023 then stopped for days then now back. Clotting now. Using approx 7 over sized pads/day. Using Ibuprofen  800mg  approx 3-4 past few days. RX per Dr. April Knack. Feels like urinating a lot but no S/X UTI. Wants to discuss solutions for bleeding problem.

## 2023-11-13 LAB — CBC
Hematocrit: 30.2 % — ABNORMAL LOW (ref 34.0–46.6)
Hemoglobin: 9.5 g/dL — ABNORMAL LOW (ref 11.1–15.9)
MCH: 29.1 pg (ref 26.6–33.0)
MCHC: 31.5 g/dL (ref 31.5–35.7)
MCV: 92 fL (ref 79–97)
Platelets: 316 10*3/uL (ref 150–450)
RBC: 3.27 x10E6/uL — ABNORMAL LOW (ref 3.77–5.28)
RDW: 12.6 % (ref 11.7–15.4)
WBC: 6.1 10*3/uL (ref 3.4–10.8)

## 2023-11-25 ENCOUNTER — Other Ambulatory Visit: Payer: Self-pay | Admitting: Interventional Radiology

## 2023-11-25 ENCOUNTER — Other Ambulatory Visit: Payer: Self-pay | Admitting: Obstetrics and Gynecology

## 2023-11-25 DIAGNOSIS — D259 Leiomyoma of uterus, unspecified: Secondary | ICD-10-CM

## 2023-11-30 ENCOUNTER — Other Ambulatory Visit: Payer: Self-pay | Admitting: Internal Medicine

## 2023-11-30 DIAGNOSIS — D509 Iron deficiency anemia, unspecified: Secondary | ICD-10-CM

## 2023-12-29 LAB — HM MAMMOGRAPHY

## 2023-12-30 ENCOUNTER — Encounter: Payer: Self-pay | Admitting: Gastroenterology

## 2024-01-01 ENCOUNTER — Encounter: Payer: No Typology Code available for payment source | Admitting: Gastroenterology

## 2024-01-01 ENCOUNTER — Telehealth: Payer: Self-pay | Admitting: Gastroenterology

## 2024-01-01 NOTE — Telephone Encounter (Signed)
 Hi Dr Chales Abrahams,   I called patient at 7:11 am and left her a voicemail, patient's has not show for her appointment. I will no show her for today.    Thank you

## 2024-01-19 LAB — HM COLONOSCOPY

## 2024-02-18 ENCOUNTER — Other Ambulatory Visit: Payer: Self-pay | Admitting: Obstetrics and Gynecology

## 2024-02-18 DIAGNOSIS — N920 Excessive and frequent menstruation with regular cycle: Secondary | ICD-10-CM

## 2024-04-26 ENCOUNTER — Telehealth: Payer: Self-pay

## 2024-04-26 NOTE — Telephone Encounter (Signed)
 Spoke to the patient.  She thought she was scheduled for a physical.  Appointment cancelled.

## 2024-04-26 NOTE — Telephone Encounter (Signed)
 Copied from CRM [id number]. Topic: General - Other >> Apr 26, 2024  1:40 PM Misty Flynn I wrote: Reason for CRM: Patient would like to know if labs are going to be done at her appt tomorrow and if she needs to fast?

## 2024-04-27 ENCOUNTER — Ambulatory Visit: Admitting: Internal Medicine

## 2024-06-03 ENCOUNTER — Ambulatory Visit: Payer: Self-pay

## 2024-06-03 NOTE — Telephone Encounter (Signed)
 First attempt: LVM for patient to return call to [phone]  Message from [place] S sent at 06/03/2024 10:08 AM EDT  Summary: diarrhea and nausea   Diarrhea and nausea

## 2024-06-03 NOTE — Telephone Encounter (Signed)
 Noted

## 2024-06-03 NOTE — Telephone Encounter (Signed)
 FYI Only or Action Required?: Action required by provider: update on patient condition.  Patient was last seen in primary care on 09/09/2022 by Theophilus Andrews, Tully GRADE, MD.  Called Nurse Triage reporting Diarrhea and Nausea.  Symptoms began unknown.  Interventions attempted: Other: patient at [facility] today.  Symptoms are: unknown.  Triage Disposition: Information or Advice Only Call  Patient/caregiver understands and will follow disposition?: Yes  Summary: diarrhea and nausea   Diarrhea and nausea       Reason for Disposition  [1] Follow-up call to recent contact AND [2] information only call, no triage required  Answer Assessment - Initial Assessment Questions 1. REASON FOR CALL: What is the main reason for your call? or How can I best help you?     Patient called with symptoms of diarrhea and  nausea Patient went to UC before she was able to be triaged. Patient at [facility] at time of call  Encouraged to call or follow up with any other questions of concerns.   She verbalizes understanding  Protocols used: Information Only Call - No Triage-A-AH

## 2024-06-06 LAB — LAB REPORT - SCANNED: EGFR: 86

## 2024-06-09 ENCOUNTER — Telehealth: Payer: Self-pay

## 2024-06-09 NOTE — Transitions of Care (Post Inpatient/ED Visit) (Signed)
   06/09/2024  Name: Misty Flynn MRN: [id number] DOB: 1971/05/03  Today's TOC FU Call Status: Today's TOC FU Call Status:: Successful TOC FU Call Completed TOC FU Call Complete Date: 06/09/24 Patient's Name and Date of Birth confirmed.  Transition Care Management Follow-up Telephone Call Date of Discharge: 06/06/24 Discharge Facility: Other [facility]) Name of Other (Non-[facility]) Discharge Facility: [facility] Type of Discharge: Emergency Department Reason for ED Visit: Other: How have you been since you were released from the hospital?: Same Any questions or concerns?: No  Items Reviewed: Did you receive and understand the discharge instructions provided?: Yes Medications obtained,verified, and reconciled?: Yes (Medications Reviewed) Any new allergies since your discharge?: No Dietary orders reviewed?: No Do you have support at home?: No  Medications Reviewed Today: Medications Reviewed Today     Reviewed by Elner Shan NOVAK, CMA (Certified Medical Assistant) on 06/09/24 at 1327  Med List Status: <None>   Medication Order Taking? Sig Documenting Provider Last Dose Status Informant  cyanocobalamin  (VITAMIN B12) 1000 MCG tablet [id number] Yes Take 1,000 mcg by mouth daily. [provider]  Active   ferrous sulfate  325 (65 FE) MG tablet [id number] Yes TAKE 325 MG BY MOUTH 2 (TWO) TIMES DAILY. Theophilus Andrews, Tully GRADE, MD  Active   ibuprofen  (ADVIL ) 800 MG tablet [id number] Yes TAKE 1 TABLET (800 MG TOTAL) BY MOUTH EVERY 8 (EIGHT) HOURS AS NEEDED FOR CRAMPING. Zina Jerilynn LABOR, MD  Active   megestrol  (MEGACE ) 40 MG tablet [id number] Yes Take 1 tablet (40 mg total) by mouth 2 (two) times daily. Can increase to two tablets twice a day in the event of heavy bleeding Zina Jerilynn LABOR, MD  Active   ondansetron  (ZOFRAN ) 4 MG tablet [id number]  Take 1 tablet (4 mg total) by mouth as directed. Take one Zofran  4 mg tablet 30-60 minutes before  each colonoscopy prep dose  Patient not taking: Reported on 06/09/2024   Charlanne Groom, MD  Active   tranexamic acid  (LYSTEDA ) 650 MG TABS tablet [id number]  Take 2 tablets (1,300 mg total) by mouth 3 (three) times daily. Take during menses for a maximum of five days  Patient not taking: Reported on 06/09/2024   Zina Jerilynn LABOR, MD  Active   valACYclovir  (VALTREX ) 500 MG tablet [id number] Yes TAKE 1 TABLET (500 MG TOTAL) BY MOUTH 2 (TWO) TIMES DAILY. WITH SYMPTOMS OF OUTBREAK. Rudy Carlin LABOR, MD  Active             Home Care and Equipment/Supplies: Were Home Health Services Ordered?: No Any new equipment or medical supplies ordered?: No  Functional Questionnaire: Do you need assistance with bathing/showering or dressing?: No Do you need assistance with meal preparation?: No Do you need assistance with eating?: No Do you have difficulty maintaining continence: No Do you need assistance with getting out of bed/getting out of a chair/moving?: No Do you have difficulty managing or taking your medications?: No  Follow up appointments reviewed: PCP Follow-up appointment confirmed?: NA Specialist Hospital Follow-up appointment confirmed?: Yes Date of Specialist follow-up appointment?: 11/30/24 Follow-Up Specialty Provider:: gi Do you need transportation to your follow-up appointment?: No Do you understand care options if your condition(s) worsen?: Yes-patient verbalized understanding    SIGNATURE Shan Elner, CMA

## 2024-06-15 ENCOUNTER — Ambulatory Visit (INDEPENDENT_AMBULATORY_CARE_PROVIDER_SITE_OTHER): Admitting: Internal Medicine

## 2024-06-15 ENCOUNTER — Encounter: Payer: Self-pay | Admitting: Internal Medicine

## 2024-06-15 VITALS — BP 120/80 | HR 67 | Temp 98.1°F | Ht 62.5 in | Wt 129.3 lb

## 2024-06-15 DIAGNOSIS — E559 Vitamin D deficiency, unspecified: Secondary | ICD-10-CM | POA: Diagnosis not present

## 2024-06-15 DIAGNOSIS — Z23 Encounter for immunization: Secondary | ICD-10-CM

## 2024-06-15 DIAGNOSIS — Z0000 Encounter for general adult medical examination without abnormal findings: Secondary | ICD-10-CM

## 2024-06-15 DIAGNOSIS — E538 Deficiency of other specified B group vitamins: Secondary | ICD-10-CM

## 2024-06-15 DIAGNOSIS — D509 Iron deficiency anemia, unspecified: Secondary | ICD-10-CM

## 2024-06-15 LAB — TSH: TSH: 0.66 u[IU]/mL (ref 0.35–5.50)

## 2024-06-15 LAB — CBC WITH DIFFERENTIAL/PLATELET
Basophils Absolute: 0 K/uL (ref 0.0–0.1)
Basophils Relative: 0.6 % (ref 0.0–3.0)
Eosinophils Absolute: 0.1 K/uL (ref 0.0–0.7)
Eosinophils Relative: 2.1 % (ref 0.0–5.0)
HCT: 40.9 % (ref 36.0–46.0)
Hemoglobin: 13.4 g/dL (ref 12.0–15.0)
Lymphocytes Relative: 40.9 % (ref 12.0–46.0)
Lymphs Abs: 1.8 K/uL (ref 0.7–4.0)
MCHC: 32.7 g/dL (ref 30.0–36.0)
MCV: 86.9 fl (ref 78.0–100.0)
Monocytes Absolute: 0.3 K/uL (ref 0.1–1.0)
Monocytes Relative: 7 % (ref 3.0–12.0)
Neutro Abs: 2.2 K/uL (ref 1.4–7.7)
Neutrophils Relative %: 49.4 % (ref 43.0–77.0)
Platelets: 311 K/uL (ref 150.0–400.0)
RBC: 4.7 Mil/uL (ref 3.87–5.11)
RDW: 13.6 % (ref 11.5–15.5)
WBC: 4.5 K/uL (ref 4.0–10.5)

## 2024-06-15 LAB — LIPID PANEL
Cholesterol: 146 mg/dL (ref 0–200)
HDL: 53.1 mg/dL (ref >39.00)
LDL Cholesterol: 83 mg/dL (ref 0–99)
NonHDL: 92.71
Total CHOL/HDL Ratio: 3
Triglycerides: 50 mg/dL (ref 0.0–149.0)
VLDL: 10 mg/dL (ref 0.0–40.0)

## 2024-06-15 LAB — COMPREHENSIVE METABOLIC PANEL WITH GFR
ALT: 10 U/L (ref 0–35)
AST: 17 U/L (ref 0–37)
Albumin: 4.1 g/dL (ref 3.5–5.2)
Alkaline Phosphatase: 49 U/L (ref 39–117)
BUN: 9 mg/dL (ref 6–23)
CO2: 30 meq/L (ref 19–32)
Calcium: 9.3 mg/dL (ref 8.4–10.5)
Chloride: 103 meq/L (ref 96–112)
Creatinine, Ser: 0.82 mg/dL (ref 0.40–1.20)
GFR: 83.27 mL/min (ref >60.00)
Glucose, Bld: 93 mg/dL (ref 70–99)
Potassium: 4.7 meq/L (ref 3.5–5.1)
Sodium: 138 meq/L (ref 135–145)
Total Bilirubin: 0.5 mg/dL (ref 0.2–1.2)
Total Protein: 6.9 g/dL (ref 6.0–8.3)

## 2024-06-15 LAB — VITAMIN D 25 HYDROXY (VIT D DEFICIENCY, FRACTURES): VITD: 24.92 ng/mL — ABNORMAL LOW (ref 30.00–100.00)

## 2024-06-15 LAB — VITAMIN B12: Vitamin B-12: 310 pg/mL (ref 211–911)

## 2024-06-15 NOTE — Progress Notes (Signed)
 Established Patient Office Visit     CC/Reason for Visit: Annual preventive exam  HPI: Misty Flynn is a 53 y.o. female who is coming in today for the above mentioned reasons. Past Medical History is significant for: Iron  deficiency due to uterine fibroid and now status post partial hysterectomy and followed by GYN, history of genital herpes.  Is scheduled for an upper endoscopy soon due to vomiting and abdominal pain.  Workup in the ED was normal including CAT scan.  And all cancer screening is up-to-date and we are requesting records.  Is due for shingles and pneumonia vaccines.   Past Medical/Surgical History: Past Medical History:  Diagnosis Date   Allergy    Anemia    History of chicken pox    HSV (herpes simplex virus) infection     Past Surgical History:  Procedure Laterality Date   CESAREAN SECTION     HERNIA REPAIR      Social History:  reports that she has never smoked. She has never used smokeless tobacco. She reports that she does not drink alcohol and does not use drugs.  Allergies: No Known Allergies  Family History:  Family History  Problem Relation Age of Onset   Cancer - Prostate Father    Diabetes Maternal Grandmother    Cancer Maternal Grandfather        prostate   Colon cancer Neg Hx    Colon polyps Neg Hx    Esophageal cancer Neg Hx    Rectal cancer Neg Hx    Stomach cancer Neg Hx      Current Outpatient Medications:    B Complex Vitamins (B COMPLEX-B12) TABS, Take by mouth., Disp: , Rfl:    ferrous sulfate  325 (65 FE) MG tablet, TAKE 325 MG BY MOUTH 2 (TWO) TIMES DAILY., Disp: 180 tablet, Rfl: 1   ibuprofen  (ADVIL ) 800 MG tablet, TAKE 1 TABLET (800 MG TOTAL) BY MOUTH EVERY 8 (EIGHT) HOURS AS NEEDED FOR CRAMPING., Disp: 30 tablet, Rfl: 5   ondansetron  (ZOFRAN ) 4 MG tablet, Take 1 tablet (4 mg total) by mouth as directed. Take one Zofran  4 mg tablet 30-60 minutes before each colonoscopy prep dose, Disp: 4 tablet, Rfl: 0   valACYclovir   (VALTREX ) 500 MG tablet, TAKE 1 TABLET (500 MG TOTAL) BY MOUTH 2 (TWO) TIMES DAILY. WITH SYMPTOMS OF OUTBREAK., Disp: 30 tablet, Rfl: 22   cyanocobalamin  (VITAMIN B12) 1000 MCG tablet, Take 1,000 mcg by mouth daily., Disp: , Rfl:    megestrol  (MEGACE ) 40 MG tablet, Take 1 tablet (40 mg total) by mouth 2 (two) times daily. Can increase to two tablets twice a day in the event of heavy bleeding, Disp: 60 tablet, Rfl: 5   tranexamic acid  (LYSTEDA ) 650 MG TABS tablet, Take 2 tablets (1,300 mg total) by mouth 3 (three) times daily. Take during menses for a maximum of five days (Patient not taking: Reported on 06/09/2024), Disp: 30 tablet, Rfl: 2  Review of Systems:  Negative unless indicated in HPI.   Physical Exam: Vitals:   06/15/24 0905  BP: 120/80  Pulse: 67  Temp: 98.1 F (36.7 C)  TempSrc: Oral  SpO2: 98%  Weight: 129 lb 4.8 oz (58.7 kg)  Height: 5' 2.5 (1.588 m)    Body mass index is 23.27 kg/m.   Physical Exam Vitals reviewed.  Constitutional:      General: She is not in acute distress.    Appearance: Normal appearance. She is not ill-appearing, toxic-appearing or diaphoretic.  HENT:     Head: Normocephalic.     Right Ear: Tympanic membrane, ear canal and external ear normal. There is no impacted cerumen.     Left Ear: Tympanic membrane, ear canal and external ear normal. There is no impacted cerumen.     Nose: Nose normal.     Mouth/Throat:     Mouth: Mucous membranes are moist.     Pharynx: Oropharynx is clear. No oropharyngeal exudate or posterior oropharyngeal erythema.  Eyes:     General: No scleral icterus.       Right eye: No discharge.        Left eye: No discharge.     Conjunctiva/sclera: Conjunctivae normal.     Pupils: Pupils are equal, round, and reactive to light.  Neck:     Vascular: No carotid bruit.  Cardiovascular:     Rate and Rhythm: Normal rate and regular rhythm.     Pulses: Normal pulses.     Heart sounds: Normal heart sounds.  Pulmonary:      Effort: Pulmonary effort is normal. No respiratory distress.     Breath sounds: Normal breath sounds.  Abdominal:     General: Abdomen is flat. Bowel sounds are normal.     Palpations: Abdomen is soft.  Musculoskeletal:        General: Normal range of motion.     Cervical back: Normal range of motion.  Skin:    General: Skin is warm and dry.  Neurological:     General: No focal deficit present.     Mental Status: She is alert and oriented to person, place, and time. Mental status is at baseline.  Psychiatric:        Mood and Affect: Mood normal.        Behavior: Behavior normal.        Thought Content: Thought content normal.        Judgment: Judgment normal.       Impression and Plan:  Encounter for preventive health examination  Iron  deficiency anemia, unspecified iron  deficiency anemia type -     CBC with Differential/Platelet; Future -     Comprehensive metabolic panel with GFR; Future -     TSH; Future -     Lipid panel; Future  Vitamin D  deficiency -     VITAMIN D  25 Hydroxy (Vit-D Deficiency, Fractures); Future  Vitamin B12 deficiency -     Vitamin B12; Future  Immunization due  -Recommend routine eye and dental care. -Healthy lifestyle discussed in detail. -Labs to be updated today. -Prostate cancer screening: Not applicable Health Maintenance  Topic Date Due   Hepatitis B Vaccine (1 of 3 - 19+ 3-dose series) Never done   Colon Cancer Screening  Never done   COVID-19 Vaccine (3 - Pfizer risk series) 03/27/2020   Mammogram  12/03/2022   Pneumococcal Vaccine for age over 5 (1 of 1 - PCV) Never done   Zoster (Shingles) Vaccine (1 of 2) Never done   Flu Shot  05/28/2024   Pap with HPV screening  07/27/2028   DTaP/Tdap/Td vaccine (3 - Td or Tdap) 09/09/2032   Hepatitis C Screening  Completed   HIV Screening  Completed   HPV Vaccine  Aged Out   Meningitis B Vaccine  Aged Out     -First shingles vaccine in office today, will obtain pneumonia and flu at  a later date. - Obtain records from GYN and GI.    Tully Theophilus Andrews, MD [facility]

## 2024-06-15 NOTE — Addendum Note (Signed)
 Addended by: KATHRYNE MILLMAN B on: 06/15/2024 09:57 AM   Modules accepted: Orders

## 2024-06-17 ENCOUNTER — Ambulatory Visit: Payer: Self-pay | Admitting: Internal Medicine

## 2024-06-17 DIAGNOSIS — E559 Vitamin D deficiency, unspecified: Secondary | ICD-10-CM

## 2024-06-17 MED ORDER — VITAMIN D (ERGOCALCIFEROL) 1.25 MG (50000 UNIT) PO CAPS
50000.0000 [IU] | ORAL_CAPSULE | ORAL | 0 refills | Status: AC
Start: 2024-06-17 — End: 2024-09-03

## 2024-09-02 ENCOUNTER — Telehealth: Payer: Self-pay | Admitting: *Deleted

## 2024-09-02 ENCOUNTER — Other Ambulatory Visit: Payer: Self-pay | Admitting: Internal Medicine

## 2024-09-02 DIAGNOSIS — E559 Vitamin D deficiency, unspecified: Secondary | ICD-10-CM

## 2024-09-02 NOTE — Telephone Encounter (Signed)
 Spoke to the patient and explained that she will need a lab appointment in order to get her refill.  Lab appointment was scheduled.

## 2024-09-02 NOTE — Telephone Encounter (Signed)
 Copied from CRM [id number]. Topic: Clinical - Prescription Issue >> Sep 02, 2024  1:04 PM Alfonso HERO wrote: Reason for CRM: patient calling in regarding Vitamin D . I informed its showing as being refused. She is requesting a callback.

## 2024-09-15 ENCOUNTER — Ambulatory Visit: Payer: Self-pay | Admitting: Internal Medicine

## 2024-09-15 ENCOUNTER — Ambulatory Visit (INDEPENDENT_AMBULATORY_CARE_PROVIDER_SITE_OTHER): Admitting: *Deleted

## 2024-09-15 ENCOUNTER — Other Ambulatory Visit (INDEPENDENT_AMBULATORY_CARE_PROVIDER_SITE_OTHER)

## 2024-09-15 DIAGNOSIS — Z23 Encounter for immunization: Secondary | ICD-10-CM | POA: Diagnosis not present

## 2024-09-15 DIAGNOSIS — E559 Vitamin D deficiency, unspecified: Secondary | ICD-10-CM | POA: Diagnosis not present

## 2024-09-15 LAB — VITAMIN D 25 HYDROXY (VIT D DEFICIENCY, FRACTURES): VITD: 41.02 ng/mL (ref 30.00–100.00)

## 2024-09-20 ENCOUNTER — Other Ambulatory Visit: Payer: Self-pay | Admitting: Internal Medicine

## 2024-09-20 DIAGNOSIS — E559 Vitamin D deficiency, unspecified: Secondary | ICD-10-CM
